# Patient Record
Sex: Male | Born: 1950 | Race: White | Hispanic: No | Smoking: Former smoker
Health system: Southern US, Community
[De-identification: ages and names within clinical notes are randomized; demographics above are authoritative.]

## PROBLEM LIST (undated history)

## (undated) DIAGNOSIS — E785 Hyperlipidemia, unspecified: Secondary | ICD-10-CM

## (undated) DIAGNOSIS — M21379 Foot drop, unspecified foot: Secondary | ICD-10-CM

## (undated) DIAGNOSIS — M6281 Muscle weakness (generalized): Secondary | ICD-10-CM

## (undated) DIAGNOSIS — R413 Other amnesia: Secondary | ICD-10-CM

## (undated) DIAGNOSIS — R531 Weakness: Secondary | ICD-10-CM

## (undated) DIAGNOSIS — I10 Essential (primary) hypertension: Secondary | ICD-10-CM

## (undated) DIAGNOSIS — R634 Abnormal weight loss: Secondary | ICD-10-CM

## (undated) HISTORY — DX: Essential (primary) hypertension: I10

## (undated) HISTORY — DX: Other amnesia: R41.3

## (undated) HISTORY — DX: Muscle weakness (generalized): M62.81

## (undated) HISTORY — DX: Weakness: R53.1

## (undated) HISTORY — DX: Abnormal weight loss: R63.4

## (undated) HISTORY — PX: SHOULDER SURGERY: SHX246

## (undated) HISTORY — PX: BACK SURGERY: SHX140

## (undated) HISTORY — PX: PERONEAL NERVE DECOMPRESSION: SHX2226

## (undated) HISTORY — DX: Foot drop, unspecified foot: M21.379

---

## 1999-04-24 ENCOUNTER — Ambulatory Visit (HOSPITAL_COMMUNITY): Admission: RE | Admit: 1999-04-24 | Discharge: 1999-04-24 | Payer: Self-pay | Admitting: Gastroenterology

## 1999-05-03 ENCOUNTER — Ambulatory Visit (HOSPITAL_COMMUNITY): Admission: RE | Admit: 1999-05-03 | Discharge: 1999-05-03 | Payer: Self-pay | Admitting: Gastroenterology

## 1999-05-03 ENCOUNTER — Encounter: Payer: Self-pay | Admitting: Gastroenterology

## 1999-10-09 ENCOUNTER — Ambulatory Visit (HOSPITAL_COMMUNITY): Admission: RE | Admit: 1999-10-09 | Discharge: 1999-10-09 | Payer: Self-pay | Admitting: Family Medicine

## 1999-10-09 ENCOUNTER — Encounter: Payer: Self-pay | Admitting: Family Medicine

## 1999-10-25 ENCOUNTER — Encounter: Payer: Self-pay | Admitting: Orthopedic Surgery

## 1999-10-28 ENCOUNTER — Encounter: Payer: Self-pay | Admitting: Orthopedic Surgery

## 1999-10-28 ENCOUNTER — Inpatient Hospital Stay (HOSPITAL_COMMUNITY): Admission: RE | Admit: 1999-10-28 | Discharge: 1999-10-29 | Payer: Self-pay | Admitting: Orthopedic Surgery

## 2002-04-05 ENCOUNTER — Ambulatory Visit (HOSPITAL_COMMUNITY): Admission: RE | Admit: 2002-04-05 | Discharge: 2002-04-05 | Payer: Self-pay | Admitting: Family Medicine

## 2002-04-05 ENCOUNTER — Encounter: Payer: Self-pay | Admitting: Family Medicine

## 2004-10-28 ENCOUNTER — Emergency Department (HOSPITAL_COMMUNITY): Admission: EM | Admit: 2004-10-28 | Discharge: 2004-10-28 | Payer: Self-pay | Admitting: *Deleted

## 2011-07-03 ENCOUNTER — Other Ambulatory Visit: Payer: Self-pay | Admitting: Family Medicine

## 2011-07-03 DIAGNOSIS — R109 Unspecified abdominal pain: Secondary | ICD-10-CM

## 2011-07-07 ENCOUNTER — Ambulatory Visit
Admission: RE | Admit: 2011-07-07 | Discharge: 2011-07-07 | Disposition: A | Discharge status: Home / Self Care | Payer: Managed Care, Other (non HMO) | Source: Ambulatory Visit | Attending: Family Medicine | Admitting: Family Medicine

## 2011-07-07 DIAGNOSIS — R109 Unspecified abdominal pain: Secondary | ICD-10-CM

## 2011-07-07 MED ORDER — IOHEXOL 300 MG/ML  SOLN
125.0000 mL | Freq: Once | INTRAMUSCULAR | Status: AC | PRN
  Administered 2011-07-07: 125 mL via INTRAVENOUS

## 2016-07-14 DIAGNOSIS — I10 Essential (primary) hypertension: Secondary | ICD-10-CM | POA: Diagnosis not present

## 2016-07-14 DIAGNOSIS — E782 Mixed hyperlipidemia: Secondary | ICD-10-CM | POA: Diagnosis not present

## 2016-07-14 DIAGNOSIS — F324 Major depressive disorder, single episode, in partial remission: Secondary | ICD-10-CM | POA: Diagnosis not present

## 2016-07-14 DIAGNOSIS — R7301 Impaired fasting glucose: Secondary | ICD-10-CM | POA: Diagnosis not present

## 2016-12-26 DIAGNOSIS — J069 Acute upper respiratory infection, unspecified: Secondary | ICD-10-CM | POA: Diagnosis not present

## 2016-12-30 DIAGNOSIS — R05 Cough: Secondary | ICD-10-CM | POA: Diagnosis not present

## 2016-12-30 DIAGNOSIS — R110 Nausea: Secondary | ICD-10-CM | POA: Diagnosis not present

## 2017-01-02 DIAGNOSIS — R42 Dizziness and giddiness: Secondary | ICD-10-CM | POA: Diagnosis not present

## 2017-01-19 ENCOUNTER — Ambulatory Visit
Admission: RE | Admit: 2017-01-19 | Discharge: 2017-01-19 | Disposition: A | Discharge status: Home / Self Care | Payer: BLUE CROSS/BLUE SHIELD | Source: Ambulatory Visit | Attending: Family Medicine | Admitting: Family Medicine

## 2017-01-19 ENCOUNTER — Other Ambulatory Visit: Payer: Self-pay | Admitting: Family Medicine

## 2017-01-19 DIAGNOSIS — R002 Palpitations: Secondary | ICD-10-CM | POA: Diagnosis not present

## 2017-01-19 DIAGNOSIS — R05 Cough: Secondary | ICD-10-CM

## 2017-01-19 DIAGNOSIS — R059 Cough, unspecified: Secondary | ICD-10-CM

## 2017-01-19 DIAGNOSIS — I10 Essential (primary) hypertension: Secondary | ICD-10-CM | POA: Diagnosis not present

## 2017-01-19 DIAGNOSIS — E782 Mixed hyperlipidemia: Secondary | ICD-10-CM | POA: Diagnosis not present

## 2017-01-19 DIAGNOSIS — R5383 Other fatigue: Secondary | ICD-10-CM | POA: Diagnosis not present

## 2017-01-19 DIAGNOSIS — Z125 Encounter for screening for malignant neoplasm of prostate: Secondary | ICD-10-CM | POA: Diagnosis not present

## 2017-01-19 DIAGNOSIS — Z0000 Encounter for general adult medical examination without abnormal findings: Secondary | ICD-10-CM | POA: Diagnosis not present

## 2017-01-19 DIAGNOSIS — R110 Nausea: Secondary | ICD-10-CM | POA: Diagnosis not present

## 2017-01-19 DIAGNOSIS — R7301 Impaired fasting glucose: Secondary | ICD-10-CM | POA: Diagnosis not present

## 2017-02-04 DIAGNOSIS — E782 Mixed hyperlipidemia: Secondary | ICD-10-CM | POA: Diagnosis not present

## 2017-10-15 DIAGNOSIS — R7303 Prediabetes: Secondary | ICD-10-CM | POA: Diagnosis not present

## 2017-10-15 DIAGNOSIS — I10 Essential (primary) hypertension: Secondary | ICD-10-CM | POA: Diagnosis not present

## 2017-10-15 DIAGNOSIS — E782 Mixed hyperlipidemia: Secondary | ICD-10-CM | POA: Diagnosis not present

## 2017-10-15 DIAGNOSIS — K5909 Other constipation: Secondary | ICD-10-CM | POA: Diagnosis not present

## 2017-11-16 DIAGNOSIS — E782 Mixed hyperlipidemia: Secondary | ICD-10-CM | POA: Diagnosis not present

## 2017-11-16 DIAGNOSIS — E291 Testicular hypofunction: Secondary | ICD-10-CM | POA: Diagnosis not present

## 2017-11-16 DIAGNOSIS — I10 Essential (primary) hypertension: Secondary | ICD-10-CM | POA: Diagnosis not present

## 2017-11-16 DIAGNOSIS — R7303 Prediabetes: Secondary | ICD-10-CM | POA: Diagnosis not present

## 2018-05-10 DIAGNOSIS — E782 Mixed hyperlipidemia: Secondary | ICD-10-CM | POA: Diagnosis not present

## 2018-05-10 DIAGNOSIS — F324 Major depressive disorder, single episode, in partial remission: Secondary | ICD-10-CM | POA: Diagnosis not present

## 2018-05-10 DIAGNOSIS — I10 Essential (primary) hypertension: Secondary | ICD-10-CM | POA: Diagnosis not present

## 2018-05-10 DIAGNOSIS — R7303 Prediabetes: Secondary | ICD-10-CM | POA: Diagnosis not present

## 2019-02-25 DIAGNOSIS — K219 Gastro-esophageal reflux disease without esophagitis: Secondary | ICD-10-CM | POA: Diagnosis not present

## 2019-02-25 DIAGNOSIS — F324 Major depressive disorder, single episode, in partial remission: Secondary | ICD-10-CM | POA: Diagnosis not present

## 2019-02-25 DIAGNOSIS — E782 Mixed hyperlipidemia: Secondary | ICD-10-CM | POA: Diagnosis not present

## 2019-02-25 DIAGNOSIS — R7303 Prediabetes: Secondary | ICD-10-CM | POA: Diagnosis not present

## 2019-02-25 DIAGNOSIS — Z125 Encounter for screening for malignant neoplasm of prostate: Secondary | ICD-10-CM | POA: Diagnosis not present

## 2019-02-25 DIAGNOSIS — I10 Essential (primary) hypertension: Secondary | ICD-10-CM | POA: Diagnosis not present

## 2019-03-15 DIAGNOSIS — M79641 Pain in right hand: Secondary | ICD-10-CM | POA: Diagnosis not present

## 2019-03-15 DIAGNOSIS — M13841 Other specified arthritis, right hand: Secondary | ICD-10-CM | POA: Diagnosis not present

## 2019-04-18 DIAGNOSIS — M1811 Unilateral primary osteoarthritis of first carpometacarpal joint, right hand: Secondary | ICD-10-CM | POA: Diagnosis not present

## 2019-04-18 DIAGNOSIS — M79642 Pain in left hand: Secondary | ICD-10-CM | POA: Diagnosis not present

## 2019-04-18 DIAGNOSIS — M79643 Pain in unspecified hand: Secondary | ICD-10-CM | POA: Diagnosis not present

## 2019-04-18 DIAGNOSIS — M2550 Pain in unspecified joint: Secondary | ICD-10-CM | POA: Diagnosis not present

## 2019-04-18 DIAGNOSIS — M79641 Pain in right hand: Secondary | ICD-10-CM | POA: Diagnosis not present

## 2019-04-18 DIAGNOSIS — R768 Other specified abnormal immunological findings in serum: Secondary | ICD-10-CM | POA: Diagnosis not present

## 2019-04-18 DIAGNOSIS — M1990 Unspecified osteoarthritis, unspecified site: Secondary | ICD-10-CM | POA: Diagnosis not present

## 2019-04-25 DIAGNOSIS — R768 Other specified abnormal immunological findings in serum: Secondary | ICD-10-CM | POA: Diagnosis not present

## 2019-04-25 DIAGNOSIS — M2550 Pain in unspecified joint: Secondary | ICD-10-CM | POA: Diagnosis not present

## 2019-04-25 DIAGNOSIS — D8989 Other specified disorders involving the immune mechanism, not elsewhere classified: Secondary | ICD-10-CM | POA: Diagnosis not present

## 2019-05-30 DIAGNOSIS — M1990 Unspecified osteoarthritis, unspecified site: Secondary | ICD-10-CM | POA: Diagnosis not present

## 2019-05-30 DIAGNOSIS — M79643 Pain in unspecified hand: Secondary | ICD-10-CM | POA: Diagnosis not present

## 2019-05-30 DIAGNOSIS — M2550 Pain in unspecified joint: Secondary | ICD-10-CM | POA: Diagnosis not present

## 2019-05-30 DIAGNOSIS — R768 Other specified abnormal immunological findings in serum: Secondary | ICD-10-CM | POA: Diagnosis not present

## 2019-07-05 DIAGNOSIS — R232 Flushing: Secondary | ICD-10-CM | POA: Diagnosis not present

## 2019-07-05 DIAGNOSIS — R5383 Other fatigue: Secondary | ICD-10-CM | POA: Diagnosis not present

## 2019-07-06 ENCOUNTER — Emergency Department (HOSPITAL_BASED_OUTPATIENT_CLINIC_OR_DEPARTMENT_OTHER): Payer: BC Managed Care – PPO

## 2019-07-06 ENCOUNTER — Other Ambulatory Visit: Payer: Self-pay

## 2019-07-06 ENCOUNTER — Emergency Department (HOSPITAL_BASED_OUTPATIENT_CLINIC_OR_DEPARTMENT_OTHER)
Admission: EM | Admit: 2019-07-06 | Discharge: 2019-07-06 | Disposition: A | Discharge status: Home / Self Care | Payer: BC Managed Care – PPO | Attending: Emergency Medicine | Admitting: Emergency Medicine

## 2019-07-06 ENCOUNTER — Encounter (HOSPITAL_BASED_OUTPATIENT_CLINIC_OR_DEPARTMENT_OTHER): Payer: Self-pay

## 2019-07-06 DIAGNOSIS — Z87891 Personal history of nicotine dependence: Secondary | ICD-10-CM | POA: Insufficient documentation

## 2019-07-06 DIAGNOSIS — Y92015 Private garage of single-family (private) house as the place of occurrence of the external cause: Secondary | ICD-10-CM | POA: Diagnosis not present

## 2019-07-06 DIAGNOSIS — S2241XA Multiple fractures of ribs, right side, initial encounter for closed fracture: Secondary | ICD-10-CM

## 2019-07-06 DIAGNOSIS — W1789XA Other fall from one level to another, initial encounter: Secondary | ICD-10-CM | POA: Insufficient documentation

## 2019-07-06 DIAGNOSIS — R5383 Other fatigue: Secondary | ICD-10-CM | POA: Diagnosis not present

## 2019-07-06 DIAGNOSIS — Y939 Activity, unspecified: Secondary | ICD-10-CM | POA: Insufficient documentation

## 2019-07-06 DIAGNOSIS — S2231XA Fracture of one rib, right side, initial encounter for closed fracture: Secondary | ICD-10-CM | POA: Diagnosis not present

## 2019-07-06 DIAGNOSIS — Y999 Unspecified external cause status: Secondary | ICD-10-CM | POA: Insufficient documentation

## 2019-07-06 DIAGNOSIS — R232 Flushing: Secondary | ICD-10-CM | POA: Diagnosis not present

## 2019-07-06 DIAGNOSIS — R7303 Prediabetes: Secondary | ICD-10-CM | POA: Diagnosis not present

## 2019-07-06 DIAGNOSIS — W19XXXA Unspecified fall, initial encounter: Secondary | ICD-10-CM | POA: Diagnosis not present

## 2019-07-06 DIAGNOSIS — S29001A Unspecified injury of muscle and tendon of front wall of thorax, initial encounter: Secondary | ICD-10-CM | POA: Diagnosis present

## 2019-07-06 DIAGNOSIS — R52 Pain, unspecified: Secondary | ICD-10-CM | POA: Diagnosis not present

## 2019-07-06 HISTORY — DX: Hyperlipidemia, unspecified: E78.5

## 2019-07-06 MED ORDER — LIDOCAINE 5 % EX PTCH: 1.0000 | MEDICATED_PATCH | CUTANEOUS | 0 refills | Status: AC

## 2019-07-06 MED ORDER — OXYCODONE HCL 5 MG PO TABS: 5.0000 mg | ORAL_TABLET | ORAL | 0 refills | Status: DC | PRN

## 2019-07-06 MED ORDER — HYDROCODONE-ACETAMINOPHEN 5-325 MG PO TABS
1.0000 | ORAL_TABLET | Freq: Once | ORAL | Status: DC
Start: 2019-07-06 — End: 2019-07-07

## 2019-07-06 NOTE — ED Triage Notes (Signed)
Pt presents via [facility] from home after fall in garage- pt hit edge of compressor to R sided chest- No LOC, or head injury. Pt c/o R sided rib pain- some abrasion noted. Pain increased with movement. Lung sounds clear/equal.

## 2019-07-06 NOTE — Discharge Instructions (Addendum)
Take 1000 mg of Tylenol 4 times a day, 800 mg of Motrin 3 times a day, Roxicodone as needed, lidocaine patch every 24 hours, use incentive spirometry as often as possible.  Return to the ED if worsening pain, fever, cough

## 2019-07-06 NOTE — ED Provider Notes (Signed)
[facility] EMERGENCY DEPARTMENT Provider Note   CSN: [id number] Arrival date & time: 07/06/19  2026    History   Chief Complaint Chief Complaint  Patient presents with  . Fall    HPI Donald Washington is a 68 y.o. male.     The history is provided by the patient.  Fall This is a new problem. The current episode started less than 1 hour ago. The problem occurs constantly. The problem has not changed since onset.Associated symptoms include chest pain (right posterior rib pain ). Pertinent negatives include no abdominal pain and no shortness of breath. Nothing aggravates the symptoms. Nothing relieves the symptoms. He has tried nothing for the symptoms. The treatment provided no relief.    Past Medical History:  Diagnosis Date  . Hyperlipemia     There are no active problems to display for this patient.   History reviewed. No pertinent surgical history.      Home Medications    Prior to Admission medications   Medication Sig Start Date End Date Taking? Authorizing Provider  lidocaine (LIDODERM) 5 % Place 1 patch onto the skin daily. Remove & Discard patch within 12 hours or as directed by MD 07/06/19 08/05/19  Lennice Sites, DO  oxyCODONE (ROXICODONE) 5 MG immediate release tablet Take 1 tablet (5 mg total) by mouth every 4 (four) hours as needed for up to 15 doses for severe pain. 07/06/19   Lennice Sites, DO    Family History No family history on file.  Social History Social History   Tobacco Use  . Smoking status: Former Smoker  Substance Use Topics  . Alcohol use: Never    Frequency: Never  . Drug use: Never     Allergies   Patient has no allergy information on record.   Review of Systems Review of Systems  Constitutional: Negative for chills and fever.  HENT: Negative for ear pain and sore throat.   Eyes: Negative for pain and visual disturbance.  Respiratory: Negative for cough and shortness of breath.   Cardiovascular: Positive for chest  pain (right posterior rib pain ). Negative for palpitations.  Gastrointestinal: Negative for abdominal pain and vomiting.  Genitourinary: Negative for dysuria and hematuria.  Musculoskeletal: Negative for arthralgias and back pain.  Skin: Negative for color change and rash.  Neurological: Negative for seizures and syncope.  All other systems reviewed and are negative.    Physical Exam Updated Vital Signs BP 125/84   Pulse 89   Temp 99.2 F (37.3 C) (Oral)   Resp 17   Ht 6\' 2"  (1.88 m)   Wt 88.5 kg   SpO2 98%   BMI 25.04 kg/m   Physical Exam Vitals signs and nursing note reviewed.  Constitutional:      Appearance: He is well-developed.  HENT:     Head: Normocephalic and atraumatic.     Nose: Nose normal.     Mouth/Throat:     Mouth: Mucous membranes are moist.  Eyes:     Extraocular Movements: Extraocular movements intact.     Conjunctiva/sclera: Conjunctivae normal.     Pupils: Pupils are equal, round, and reactive to light.  Neck:     Musculoskeletal: Normal range of motion and neck supple. No muscular tenderness.  Cardiovascular:     Rate and Rhythm: Normal rate and regular rhythm.     Pulses: Normal pulses.     Heart sounds: Normal heart sounds. No murmur.  Pulmonary:     Effort: Pulmonary effort  is normal. No respiratory distress.     Breath sounds: Normal breath sounds.  Abdominal:     Palpations: Abdomen is soft.     Tenderness: There is no abdominal tenderness.  Musculoskeletal: Normal range of motion.        General: Tenderness (TTP to posterior right ribs) present.  Skin:    General: Skin is warm and dry.  Neurological:     General: No focal deficit present.     Mental Status: He is alert and oriented to person, place, and time.     Cranial Nerves: No cranial nerve deficit.      ED Treatments / Results  Labs (all labs ordered are listed, but only abnormal results are displayed) Labs Reviewed - No data to display  EKG None  Radiology Dg  Chest 2 View  Result Date: 07/06/2019 CLINICAL DATA:  Right posterior lower rib pain. EXAM: CHEST - 2 VIEW COMPARISON:  None. FINDINGS: There are acute appearing mildly displaced fractures of the seventh and eighth ribs posteriorly on the right. There is no evidence for right-sided pneumothorax. Heart size is normal. Aortic calcifications are noted. No large pleural effusion. There are advanced degenerative changes of the right AC joint. There are degenerative changes of both glenohumeral joints. IMPRESSION: Acute minimally displaced fractures involving the seventh eighth ribs posteriorly on the right without evidence of a right-sided pneumothorax. Electronically Signed   By: Katherine Mantlehristopher  Green M.D.   On: 07/06/2019 20:52    Procedures Procedures (including critical care time)  Medications Ordered in ED Medications  HYDROcodone-acetaminophen (NORCO/VICODIN) 5-325 MG per tablet 1 tablet (0 tablets Oral Hold 07/06/19 2038)     Initial Impression / Assessment and Plan / ED Course  I have reviewed the triage vital signs and the nursing notes.  Pertinent labs & imaging results that were available during my care of the patient were reviewed by me and considered in my medical decision making (see chart for details).     Donald Washington is a 68 year old male with history of high cholesterol presents to the ED with right-sided rib pain after falling on top of object at home.  Patient tripped over back and hit the right side of his ribs.  Patient with clear breath sounds.  Tenderness over the posterior right ribs.  Has fracture of the seventh and eighth ribs.  No pneumothorax.  Will prescribe Roxicodone.  No active narcotic scripts.  Patient given narcotic pain medicine in the ED.  Overall pain is well controlled.  Recommend Tylenol, Motrin.  Prescribed lidocaine patch as well.  Given return precautions.  Given incentive spirometer and discharged in ED in good condition.  This chart was dictated using voice  recognition software.  Despite best efforts to proofread,  errors can occur which can change the documentation meaning.    Final Clinical Impressions(s) / ED Diagnoses   Final diagnoses:  Closed fracture of multiple ribs of right side, initial encounter    ED Discharge Orders         Ordered    oxyCODONE (ROXICODONE) 5 MG immediate release tablet  Every 4 hours PRN     07/06/19 2112    lidocaine (LIDODERM) 5 %  Every 24 hours     07/06/19 2112           Virgina NorfolkCuratolo, Teliyah Royal, DO 07/06/19 2114

## 2019-07-06 NOTE — Patient Instructions (Signed)
Instructed patient on the proper use of a flutter valve. Patient demonstrated proper use X ten without difficulty and pain involvement.  Patient has numerous rib fractures and explained benefits of using device every hour. Patient tolerated well.

## 2019-07-21 NOTE — ED Notes (Signed)
Copy of AVS reprinted for pt.

## 2019-07-28 DIAGNOSIS — F324 Major depressive disorder, single episode, in partial remission: Secondary | ICD-10-CM | POA: Diagnosis not present

## 2019-07-28 DIAGNOSIS — S2239XA Fracture of one rib, unspecified side, initial encounter for closed fracture: Secondary | ICD-10-CM | POA: Diagnosis not present

## 2019-07-28 DIAGNOSIS — I10 Essential (primary) hypertension: Secondary | ICD-10-CM | POA: Diagnosis not present

## 2019-07-28 DIAGNOSIS — R7303 Prediabetes: Secondary | ICD-10-CM | POA: Diagnosis not present

## 2019-08-08 DIAGNOSIS — E291 Testicular hypofunction: Secondary | ICD-10-CM | POA: Diagnosis not present

## 2019-08-22 ENCOUNTER — Other Ambulatory Visit: Payer: Self-pay | Admitting: Family Medicine

## 2019-08-22 DIAGNOSIS — R634 Abnormal weight loss: Secondary | ICD-10-CM | POA: Diagnosis not present

## 2019-08-22 DIAGNOSIS — M21372 Foot drop, left foot: Secondary | ICD-10-CM | POA: Diagnosis not present

## 2019-08-22 DIAGNOSIS — M21371 Foot drop, right foot: Secondary | ICD-10-CM | POA: Diagnosis not present

## 2019-08-22 DIAGNOSIS — R531 Weakness: Secondary | ICD-10-CM | POA: Diagnosis not present

## 2019-08-23 ENCOUNTER — Other Ambulatory Visit: Payer: Self-pay | Admitting: Family Medicine

## 2019-08-23 DIAGNOSIS — M21372 Foot drop, left foot: Secondary | ICD-10-CM

## 2019-08-24 ENCOUNTER — Other Ambulatory Visit: Payer: Self-pay | Admitting: Family Medicine

## 2019-08-24 DIAGNOSIS — R61 Generalized hyperhidrosis: Secondary | ICD-10-CM

## 2019-08-24 DIAGNOSIS — R634 Abnormal weight loss: Secondary | ICD-10-CM

## 2019-08-26 DIAGNOSIS — M419 Scoliosis, unspecified: Secondary | ICD-10-CM | POA: Diagnosis not present

## 2019-08-26 DIAGNOSIS — M47816 Spondylosis without myelopathy or radiculopathy, lumbar region: Secondary | ICD-10-CM | POA: Diagnosis not present

## 2019-08-26 DIAGNOSIS — M5136 Other intervertebral disc degeneration, lumbar region: Secondary | ICD-10-CM | POA: Diagnosis not present

## 2019-09-06 DIAGNOSIS — M21371 Foot drop, right foot: Secondary | ICD-10-CM | POA: Diagnosis not present

## 2019-09-06 DIAGNOSIS — M21372 Foot drop, left foot: Secondary | ICD-10-CM | POA: Diagnosis not present

## 2019-09-12 DIAGNOSIS — R6881 Early satiety: Secondary | ICD-10-CM | POA: Diagnosis not present

## 2019-09-12 DIAGNOSIS — M21372 Foot drop, left foot: Secondary | ICD-10-CM | POA: Diagnosis not present

## 2019-09-12 DIAGNOSIS — I10 Essential (primary) hypertension: Secondary | ICD-10-CM | POA: Diagnosis not present

## 2019-09-12 DIAGNOSIS — E782 Mixed hyperlipidemia: Secondary | ICD-10-CM | POA: Diagnosis not present

## 2019-09-15 ENCOUNTER — Other Ambulatory Visit: Payer: Self-pay | Admitting: Family Medicine

## 2019-09-15 DIAGNOSIS — R6881 Early satiety: Secondary | ICD-10-CM

## 2019-09-15 DIAGNOSIS — R634 Abnormal weight loss: Secondary | ICD-10-CM

## 2019-09-26 DIAGNOSIS — G5730 Lesion of lateral popliteal nerve, unspecified lower limb: Secondary | ICD-10-CM | POA: Diagnosis not present

## 2019-09-29 ENCOUNTER — Ambulatory Visit
Admission: RE | Admit: 2019-09-29 | Discharge: 2019-09-29 | Disposition: A | Discharge status: Home / Self Care | Payer: BC Managed Care – PPO | Source: Ambulatory Visit | Attending: Family Medicine | Admitting: Family Medicine

## 2019-09-29 DIAGNOSIS — R634 Abnormal weight loss: Secondary | ICD-10-CM

## 2019-09-29 DIAGNOSIS — R6881 Early satiety: Secondary | ICD-10-CM

## 2019-10-10 DIAGNOSIS — G5732 Lesion of lateral popliteal nerve, left lower limb: Secondary | ICD-10-CM | POA: Diagnosis not present

## 2019-10-10 DIAGNOSIS — R6881 Early satiety: Secondary | ICD-10-CM | POA: Diagnosis not present

## 2019-10-10 DIAGNOSIS — I10 Essential (primary) hypertension: Secondary | ICD-10-CM | POA: Diagnosis not present

## 2019-10-10 DIAGNOSIS — G5731 Lesion of lateral popliteal nerve, right lower limb: Secondary | ICD-10-CM | POA: Diagnosis not present

## 2019-11-04 ENCOUNTER — Other Ambulatory Visit: Payer: Self-pay | Admitting: Family Medicine

## 2019-11-04 DIAGNOSIS — R61 Generalized hyperhidrosis: Secondary | ICD-10-CM

## 2019-11-04 DIAGNOSIS — R634 Abnormal weight loss: Secondary | ICD-10-CM

## 2019-11-15 ENCOUNTER — Ambulatory Visit
Admission: RE | Admit: 2019-11-15 | Discharge: 2019-11-15 | Disposition: A | Discharge status: Home / Self Care | Payer: Medicare Other | Source: Ambulatory Visit | Attending: Family Medicine | Admitting: Family Medicine

## 2019-11-15 ENCOUNTER — Other Ambulatory Visit: Payer: Self-pay

## 2019-11-15 DIAGNOSIS — R61 Generalized hyperhidrosis: Secondary | ICD-10-CM

## 2019-11-15 DIAGNOSIS — R634 Abnormal weight loss: Secondary | ICD-10-CM

## 2019-11-15 MED ORDER — IOPAMIDOL (ISOVUE-300) INJECTION 61%
100.0000 mL | Freq: Once | INTRAVENOUS | Status: AC | PRN
  Administered 2019-11-15: 100 mL via INTRAVENOUS

## 2020-02-15 ENCOUNTER — Encounter: Payer: Self-pay | Admitting: Neurology

## 2020-02-16 ENCOUNTER — Telehealth: Payer: Self-pay | Admitting: Neurology

## 2020-02-16 ENCOUNTER — Ambulatory Visit: Payer: Medicare Other | Admitting: Neurology

## 2020-02-16 NOTE — Telephone Encounter (Signed)
Called the patient to advise that our office is closing today due to the inclement weather. Patient has asked to call back to get rescheduled. Advised the patient that our office may not be available today or tomorrow to take calls due to possibly closing tomorrow as well. Advised that if he is unable to reach [place] to try again on Monday and we would get him rescheduled. The patient can be placed in a new sleep consult slot if no other opening's have come available to ensure the patient doesn't have a long wait.

## 2020-02-28 ENCOUNTER — Encounter: Payer: Self-pay | Admitting: Neurology

## 2020-02-28 ENCOUNTER — Other Ambulatory Visit: Payer: Self-pay

## 2020-02-28 ENCOUNTER — Ambulatory Visit: Payer: Medicare Other | Admitting: Neurology

## 2020-02-28 VITALS — BP 132/76 | HR 77 | Temp 97.0°F | Ht 74.0 in | Wt 169.5 lb

## 2020-02-28 DIAGNOSIS — R410 Disorientation, unspecified: Secondary | ICD-10-CM

## 2020-02-28 DIAGNOSIS — G1220 Motor neuron disease, unspecified: Secondary | ICD-10-CM | POA: Diagnosis not present

## 2020-02-28 DIAGNOSIS — R531 Weakness: Secondary | ICD-10-CM | POA: Diagnosis not present

## 2020-02-28 DIAGNOSIS — R634 Abnormal weight loss: Secondary | ICD-10-CM

## 2020-02-28 NOTE — Progress Notes (Addendum)
PATIENT: Donald Washington DOB: 02-16-1951  Chief Complaint  Patient presents with  . Memory Changes    MMSE 26/30 - 6 animals. He is here with his daughter, Aram Beecham. He is having worsening memory loss. Gives the following examples: forgetfulness, getting lost while driving, his daughter has taken over paying his bills.   . Foot Drop    His bilateral foot drop has improved with surgery. He is still in PT twice weely.   . Muscle Loss    His daughter says he has a sudden, unplanned quick loss of weight last year (now over 50lbs). He has noticed muscle loss and generalized weakness.   . Neurosurgery    Jone, Kermit Balo, MD - referring provider  . PCP    [facility] [place]     HISTORICAL  Donald Washington is a 69 year old male, seen in request by neurosurgeon Dr. Marikay Alar for evaluation of gait abnormality, muscle loss, rapid weight loss, initial evaluation was with his daughter Aram Beecham on February 28, 2020   I have reviewed and summarized the referring note from the referring physician.  He had past medical history of hypertension, hyperlipidemia, depression, taking Effexor 150 mg daily for many years, used to work as a Scientist, water quality.  He has lived by himself for many years, when he visit his daughter in Jan 2020, there was no significant abnormality found  He apparently had subacute onset gait abnormality, fell in July 2020, with right rib fracture, since then, he was noted to have rapid decline, he had left foot drop, difficulty clear his left foot from the floor, 2 months later by September 2020, he was noted to have right foot drop, he was seen by neurosurgeon Dr. 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, he denies difficulty handling his  job at that time, he was not able to go back to his work since his rib fracture on July 05, 2020, got confused easily, does not know how to pay his bill, daughter also noticed some personality change, he used to be very independent, now he has mellowed down, is more willing to accept help  In addition, he was noted to have 50 pound weight loss from February to June 2020, he denies swallowing difficulty, but complains of thick secretion at the back of his throat, lack of appetite, he denies droopy eyelid, double vision, he denies significant sensory loss, no bowel and bladder incontinence,  MRI of lumbar spine at [facility] on August 26, 2019 report, 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, right cord asymmetric disc osteophyte, causing mild canal stenosis, right foraminal narrowing at 2013, L3 and 4 hemilaminectomy with well-maintained central canal, right foraminal disc osteophyte cause severe neural foraminal narrowing, levoscoliosis,  CT of chest, abdomen, pelvic November 2020, no evidence of malignancy, coronary artery calcification and aortic atherosclerosis, degenerative changes of the lumbar spine  MRI of the brain report from [facility] on January 03, 2020, no acute abnormality, mild small vessel disease in basal ganglia  REVIEW OF SYSTEMS: Full 14 system review of systems performed and notable only for as above All other review of systems were negative.  ALLERGIES: No Known Allergies  HOME MEDICATIONS: Current Outpatient Medications  Medication Sig Dispense Refill  .  aspirin EC 81 MG tablet Take 81 mg by mouth daily.    Marland Kitchen atorvastatin (LIPITOR) 80 MG tablet Take 80 mg by mouth daily.     . valsartan-hydrochlorothiazide (DIOVAN-HCT) 320-25 MG tablet Take 1 tablet by mouth daily.     Marland Kitchen venlafaxine XR (EFFEXOR-XR) 150 MG 24 hr capsule Take 150 mg by mouth daily.      No current facility-administered medications for this visit.    PAST  MEDICAL HISTORY: Past Medical History:  Diagnosis Date  . Foot drop    bilateral  . Generalized weakness   . Hyperlipemia   . Hypertension   . Memory changes   . Muscle weakness   . Weight loss, unintentional     PAST SURGICAL HISTORY: Past Surgical History:  Procedure Laterality Date  . BACK SURGERY     low back - 80's  . PERONEAL NERVE DECOMPRESSION Bilateral   . SHOULDER SURGERY Right    rotator cuff - 90's    FAMILY HISTORY: Family History  Problem Relation Age of Onset  . Pneumonia Mother   . Esophageal cancer Father     SOCIAL HISTORY: Social History   Socioeconomic History  . Marital status: Divorced    Spouse name: Not on file  . Number of children: 2  . Years of education: 15  . Highest education level: High school graduate  Occupational History  . Occupation: Research officer, trade union  Tobacco Use  . Smoking status: Former Games developer  . Smokeless tobacco: Never Used  Substance and Sexual Activity  . Alcohol use: Yes    Comment: 02/28/20 - He was having 2 drinks per night. None in 2 weeks.  . Drug use: Never  . Sexual activity: Never  Other Topics Concern  . Not on file  Social History Narrative   Lives alone.   Right-handed.   Caffeine use: 2 cups per day.   Social Determinants of Health   Financial Resource Strain:   . Difficulty of Paying Living Expenses: Not on file  Food Insecurity:   . Worried About Programme researcher, broadcasting/film/video in the Last Year: Not on file  . Ran Out of Food in the Last Year: Not on file  Transportation Needs:   . Lack of Transportation (Medical): Not on file  . Lack of Transportation (Non-Medical): Not on file  Physical Activity:   . Days of Exercise per Week: Not on file  . Minutes of Exercise per Session: Not on file  Stress:   . Feeling of Stress : Not on file  Social Connections:   . Frequency of Communication with Friends and Family: Not on file  . Frequency of Social Gatherings with Friends and Family: Not on file  . Attends  Religious Services: Not on file  . Active Member of Clubs or Organizations: Not on file  . Attends Banker Meetings: Not on file  . Marital Status: Not on file  Intimate Partner Violence:   . Fear of Current or Ex-Partner: Not on file  . Emotionally Abused: Not on file  . Physically Abused: Not on file  . Sexually Abused: Not on file     PHYSICAL EXAM   Vitals:   02/28/20 1447  BP: 132/76  Pulse: 77  Temp: (!) 97 F (36.1 C)  Weight: 169 lb 8 oz (76.9 kg)  Height: 6\' 2"  (1.88 m)    Not recorded      Body mass index is 21.76 kg/m.  PHYSICAL EXAMNIATION:  Gen: NAD, conversant,  well nourised, well groomed                     Cardiovascular: Regular rate rhythm, no peripheral edema, warm, nontender. Eyes: Conjunctivae clear without exudates or hemorrhage Neck: Supple, no carotid bruits. Pulmonary: Clear to auscultation bilaterally   NEUROLOGICAL EXAM: MMSE - Mini Mental State Exam 02/28/2020  Orientation to time 5  Orientation to Place 4  Registration 3  Attention/ Calculation 5  Recall 0  Language- name 2 objects 2  Language- repeat 1  Language- follow 3 step command 3  Language- read & follow direction 1  Write a sentence 1  Copy design 1  Total score 26  animal naming 6.   CRANIAL NERVES: CN II: Visual fields are full to confrontation. Pupils are round equal and briskly reactive to light. CN III, IV, VI: extraocular movement are normal.  Cover and uncover testing showed mild bilateral medial rectus weakness, no ptosis. CN V: Facial sensation is intact to light touch CN VII: Face is symmetric, he has mild bilateral eye closure, cheek puff weakness, CN VIII: Hearing is normal to causal conversation. CN IX, X: Phonation is normal. CN XI: Head turning and shoulder shrug are intact, normal tongue movement, no atrophy, normal tongue movement  MOTOR: normal muscle tone, I was not able to appreciate any muscle fasciculations, he has mild neck flexion,  shoulder abduction, external flexion, elbow flexion, extension weakness, mild bilateral hip flexion, knee extension weakness, bilateral ankle dorsiflexion weakness, left side is moderate, right side is mild, mild to moderate bilateral ankle eversion, inversion weakness  REFLEXES: Reflexes are 1 and symmetric at the biceps, triceps, knees, and absent at ankles. Plantar responses are flexor.  SENSORY: Length dependent decreased to light touch, pinprick, vibratory sensation to ankle level.  COORDINATION: There is no trunk or limb dysmetria noted.  GAIT/STANCE: Needs push-up to get up from seated position, mildly unsteady, dragging his left leg more, cannot perform heel walking, difficulty with tandem and tiptoe walking Romberg is absent.   DIAGNOSTIC DATA (LABS, IMAGING, TESTING) - I reviewed patient records, labs, notes, testing and imaging myself where available.   ASSESSMENT AND PLAN  ALONSO GAPINSKI is a 70 y.o. male   Subacute onset of muscle weakness, weight loss,  On examination, he has mild eye closure, cheek puff, medial rectus weakness, mild bilateral proximal upper and lower extremity muscle weakness, bilateral ankle dorsiflexion weakness left worse than right, mild length dependent sensory changes, normal reflexes,  differentiation diagnosis including motor neuron disease, versus neuromuscular junctional disorder,  MRI of cervical, lumbar, thoracic spine to rule out structural lesion  EMG nerve conduction study  Laboratory evaluation for potential treatable etiology  Marcial Pacas, M.D. Ph.D.  [facility] [address] [place], [place] [address] Ph: [phone] Fax: [phone]  CC: Referring Provider

## 2020-02-29 ENCOUNTER — Telehealth: Payer: Self-pay | Admitting: Neurology

## 2020-02-29 NOTE — Telephone Encounter (Signed)
[employer] medicare order sent to GI. No auth they will reach out to the patient to schedule.  

## 2020-03-01 ENCOUNTER — Other Ambulatory Visit: Payer: Self-pay

## 2020-03-01 ENCOUNTER — Ambulatory Visit: Payer: Medicare Other | Admitting: Neurology

## 2020-03-01 ENCOUNTER — Ambulatory Visit (INDEPENDENT_AMBULATORY_CARE_PROVIDER_SITE_OTHER): Payer: Medicare Other | Admitting: Neurology

## 2020-03-01 DIAGNOSIS — R634 Abnormal weight loss: Secondary | ICD-10-CM | POA: Diagnosis not present

## 2020-03-01 DIAGNOSIS — R531 Weakness: Secondary | ICD-10-CM

## 2020-03-01 DIAGNOSIS — R410 Disorientation, unspecified: Secondary | ICD-10-CM

## 2020-03-01 NOTE — Progress Notes (Addendum)
PATIENT: Donald Washington DOB: [birth year]  No chief complaint on file.    HISTORICAL  Donald Washington is a 69 year old male, seen in request by neurosurgeon Dr. Sherley Bounds for evaluation of gait abnormality, muscle loss, rapid weight loss, initial evaluation was with his daughter Donald Washington on February 28, 2020   I have reviewed and summarized the referring note from the referring physician.  He had past medical history of hypertension, hyperlipidemia, depression, taking Effexor 150 mg daily for many years, used to work as a Database administrator.  He has lived by himself for many years, when he visit his daughter in Jan 2020, there was no significant abnormality found  He apparently had subacute onset gait abnormality, fell in July 2020, with right rib fracture, since then, he was noted to have rapid decline, he had left foot drop, difficulty clearing his left foot from the floor, 2 months later by September 2020, he was noted to have right foot drop, he was seen by neurosurgeon Dr. 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, he denies difficulty handling his job at that time, he was not able to go back to his work since his rib fracture on July 06, 2019, got confused easily, does not know how to pay his bill, daughter also noticed some personality change, he used to be very independent, now he has mellowed down, is more willing to accept help  In addition, he was noted to have 50 pound weight loss from February to June 2020, he denies swallowing difficulty, but complains of thick secretion at the back of his throat, lack of appetite, he denies droopy eyelid, double vision, he denies significant sensory loss, no bowel and bladder incontinence,  MRI of lumbar spine at [facility] on August 26, 2019 report, 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, right cord asymmetric disc osteophyte, causing mild canal stenosis, right foraminal narrowing at 2013, L3 and 4 hemilaminectomy with well-maintained central canal, right foraminal disc osteophyte cause severe neural foraminal narrowing, levoscoliosis,  CT of chest, abdomen, pelvic November 2020, no evidence of malignancy, coronary artery calcification and aortic atherosclerosis, degenerative changes of the lumbar spine  MRI of the brain report from [facility] on January 03, 2020, no acute abnormality, mild small vessel disease in basal ganglia  UPDATE March 01 2020: He return for electrodiagnostic study today, which showed evidence of widespread chronic neuropathic changes involving right orbicularis oculi, right sternocleidomastoid, right cervical myotomes and bilateral lumbosacral myotomes.  I was not able to appreciate any spontaneous activity in the right thoracic paraspinal muscles, or right cervical, lumbar sacral paraspinal muscles.  In addition, there is evidence of mild axonal peripheral neuropathy, bilateral carpal tunnel syndromes.  Laboratory evaluation on February 28, 2020 showed normal negative N56, RPR, HIV, folic acid, C-reactive protein, TSH, CPK, ESR, ANA, copper, angiotensin-converting enzyme, vitamin D, Lyme titer, CMP, CBC showed no significant abnormality.  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  MRI of brain and cervical spine are  still pending.  REVIEW OF SYSTEMS: Full 14 system review of systems performed and notable only  for as above All other review of systems were negative.  ALLERGIES: No Known Allergies  HOME MEDICATIONS: Current Outpatient Medications  Medication Sig Dispense Refill  . aspirin EC 81 MG tablet Take 81 mg by mouth daily.    Marland Kitchen atorvastatin (LIPITOR) 80 MG tablet Take 80 mg by mouth daily.     . valsartan-hydrochlorothiazide (DIOVAN-HCT) 320-25 MG tablet Take 1 tablet by mouth daily.     Marland Kitchen venlafaxine XR (EFFEXOR-XR) 150 MG 24 hr capsule Take 150 mg by mouth daily.      No current facility-administered medications for this visit.    PAST MEDICAL HISTORY: Past Medical History:  Diagnosis Date  . Foot drop    bilateral  . Generalized weakness   . Hyperlipemia   . Hypertension   . Memory changes   . Muscle weakness   . Weight loss, unintentional     PAST SURGICAL HISTORY: Past Surgical History:  Procedure Laterality Date  . BACK SURGERY     low back - 80's  . PERONEAL NERVE DECOMPRESSION Bilateral   . SHOULDER SURGERY Right    rotator cuff - 90's    FAMILY HISTORY: Family History  Problem Relation Age of Onset  . Pneumonia Mother   . Esophageal cancer Father     SOCIAL HISTORY: Social History   Socioeconomic History  . Marital status: Divorced    Spouse name: Not on file  . Number of children: 2  . Years of education: 85  . Highest education level: High school graduate  Occupational History  . Occupation: Media planner  Tobacco Use  . Smoking status: Former Research scientist (life sciences)  . Smokeless tobacco: Never Used  Substance and Sexual Activity  . Alcohol use: Yes    Comment: 02/28/20 - He was having 2 drinks per night. None in 2 weeks.  . Drug use: Never  . Sexual activity: Never  Other Topics Concern  . Not on file  Social History Narrative   Lives alone.   Right-handed.   Caffeine use: 2 cups per day.   Social Determinants of Health   Financial Resource Strain:   . Difficulty of Paying Living Expenses: Not on file  Food Insecurity:   . Worried About  Charity fundraiser in the Last Year: Not on file  . Ran Out of Food in the Last Year: Not on file  Transportation Needs:   . Lack of Transportation (Medical): Not on file  . Lack of Transportation (Non-Medical): Not on file  Physical Activity:   . Days of Exercise per Week: Not on file  . Minutes of Exercise per Session: Not on file  Stress:   . Feeling of Stress : Not on file  Social Connections:   . Frequency of Communication with Friends and Family: Not on file  . Frequency of Social Gatherings with Friends and Family: Not on file  . Attends Religious Services: Not on file  . Active Member of Clubs or Organizations: Not on file  . Attends Archivist Meetings: Not on file  . Marital Status: Not on file  Intimate Partner Violence:   . Fear of Current or Ex-Partner: Not on file  . Emotionally Abused: Not on file  . Physically Abused: Not on file  . Sexually Abused: Not on file     PHYSICAL EXAM   There were no vitals filed for this visit.  Not recorded  There is no height or weight on file to calculate BMI.  PHYSICAL EXAMNIATION:  NEUROLOGICAL EXAM: MMSE - Mini Mental State Exam 02/28/2020  Orientation to time 5  Orientation to Place 4  Registration 3  Attention/ Calculation 5  Recall 0  Language- name 2 objects 2  Language- repeat 1  Language- follow 3 step command 3  Language- read & follow direction 1  Write a sentence 1  Copy design 1  Total score 26  animal naming 6.   CRANIAL NERVES: CN II: Visual fields are full to confrontation. Pupils are round equal and briskly reactive to light. CN III, IV, VI: extraocular movement are normal.   CN V: Facial sensation is intact to light touch CN VII: Face is symmetric, he has mild bilateral eye closure, cheek puff weakness, CN VIII: Hearing is normal to causal conversation. CN IX, X: Phonation is normal. CN XI: Head turning and shoulder shrug are intact, normal tongue movement, no atrophy, normal  tongue movement  MOTOR: normal muscle tone, no muscle fasciculations, he has mild neck flexion, shoulder abduction, external flexion, elbow flexion, extension weakness, mild bilateral hip flexion, knee extension weakness, bilateral ankle dorsiflexion weakness, left side is moderate, right side is mild, mild to moderate bilateral ankle eversion, inversion weakness  REFLEXES: Reflexes are 1 and symmetric at the biceps, triceps, 2/2 knees, and absent at ankles. Plantar responses are extensor bilaterally  SENSORY: Length dependent decreased to light touch, pinprick, vibratory sensation to ankle level.  COORDINATION: There is no trunk or limb dysmetria noted.  GAIT/STANCE: Needs push-up to get up from seated position, mildly unsteady,     DIAGNOSTIC DATA (LABS, IMAGING, TESTING) - I reviewed patient records, labs, notes, testing and imaging myself where available.   ASSESSMENT AND PLAN  RAYSHAWN MANEY is a 69 y.o. male   Subacute onset of muscle weakness, weight loss,  Today's electrodiagnostic study showed evidence of widespread chronic neuropathic changes involving right orbicularis oculi, sternocleidomastoid, genioglossus, right cervical myotomes, and bilateral lumbosacral myotomes.  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 Ph.D.  [facility] [address], [place], [place] [address] Ph: [phone] Fax: [phone]  CC: Referring Provider

## 2020-03-01 NOTE — Procedures (Signed)
Full Name: Donald Washington Gender: Male MRN #: [id number] Date of Birth: 1951-08-24    Visit Date: 03/01/2020 07:38 Age: 69 Years Examining Physician: Levert Feinstein, MD  Referring Physician: Levert Feinstein, MD Height: 6 feet 2 inch History: 69 year old male presented with subacute onset, painless, progressive muscle weakness, weight loss.  Summary of the tests:  Nerve conduction study:  Left superficial peroneal, left median sensory responses showed mild to moderately prolonged peak latency, with mild to moderately decreased snap amplitude.  Bilateral ulnar, right median sensory response showed to be prolonged peak latency, with normal snap amplitude.  Bilateral sural, radial sensory responses were normal.  Bilateral tibial, right ulnar, left median motor responses were normal.  Bilateral peroneal to EDB motor responses showed decreased Amplitude, with mildly slow conduction velocity.  Left ulnar, and median motor responses showed moderately decreased CMAP amplitude, left ulnar motor responses also showed mildly prolonged distal latency.  Electromyography: Extensive needle examination was performed at selected muscles of bilateral lower extremity; right upper extremity; right sternocleidomastoid, right orbicularis oculi, right genioglossus, right cervical, thoracic, lumbosacral paraspinal muscles.  There was widespread chronic neuropathic changes involving bilateral lumbar sacral, right cervical myotomes, there is also evidence of chronic neuropathic changes involving right orbicularis oculi, sternocleidomastoid, genioglossus.  There was no spontaneous activity at right cervical, thoracic, lumbosacral paraspinal muscles.   Conclusion: This is an abnormal study.  There is electrodiagnostic evidence of widespread chronic neuropathic changes involving right cervical, bilateral lumbosacral myotomes, and also right bulbar muscles.  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   Differentiation diagnoses also include paraneoplastic syndrome, nutritional deficiency, infectious, inflammatory process.    ------------------------------- Levert Feinstein, M.D. PhD  [facility] [address] [place], [place] [address] Tel: [phone] Fax: [phone]         [facility]    Nerve / Sites Muscle Latency Ref. Amplitude Ref. Rel Amp Segments Distance Velocity Ref. Area    ms ms mV mV %  cm m/s m/s mVms  L Median - APB     Wrist APB 4.4 ?4.4 4.7 ?4.0 100 Wrist - APB 7   36.5     Upper arm APB 9.5  4.8  104 Upper arm - Wrist 25 49 ?49 36.5  R Median - APB     Wrist APB 4.3 ?4.4 2.8 ?4.0 100 Wrist - APB 7   13.5     Upper arm APB 9.4  2.9  102 Upper arm - Wrist 25 49 ?49 13.7  L Ulnar - ADM     Wrist ADM 3.5 ?3.3 2.0 ?6.0 100 Wrist - ADM 7   5.5     B.Elbow ADM 8.6  6.8  335 B.Elbow - Wrist 23 45 ?49 34.3     A.Elbow ADM 10.9  7.2  105 A.Elbow - B.Elbow 10 43 ?49 46.1         A.Elbow - Wrist      R Ulnar - ADM     Wrist ADM 3.3 ?3.3 6.2 ?6.0 100 Wrist - ADM 7   33.8     B.Elbow ADM 7.8  4.5  72.6 B.Elbow - Wrist 23 51 ?49 26.4     A.Elbow ADM 9.8  4.2  94.3 A.Elbow - B.Elbow 10 51 ?49 25.7         A.Elbow - Wrist      L Peroneal - EDB     Ankle EDB 6.1 ?6.5 0.2 ?2.0 100 Ankle -  EDB 9   0.9     Fib head EDB 15.2  0.2  76 Fib head - Ankle 31 34 ?44 0.7     Pop fossa EDB 19.3  0.2  92.8 Pop fossa - Fib head 10 25 ?44 0.7         Pop fossa - Ankle      R Peroneal - EDB     Ankle EDB 4.9 ?6.5 1.3 ?2.0 100 Ankle - EDB 9   5.2     Fib head EDB 12.1  0.9  64.4 Fib head - Ankle 33 46 ?44 5.1     Pop fossa EDB 14.4  0.7  85.9 Pop fossa - Fib head 10 44 ?44 3.5         Pop fossa - Ankle      L Tibial - AH     Ankle AH 4.6 ?5.8 8.7 ?4.0 100 Ankle - AH 9   19.3     Pop fossa AH 15.1  5.6  64.3 Pop fossa - Ankle 44 42 ?41 18.0  R Tibial - AH     Ankle AH 4.1 ?5.8 8.9 ?4.0 100 Ankle - AH 9   18.5     Pop fossa AH 14.8  4.9  55.2 Pop fossa - Ankle 44 41 ?41 16.2                      SNC    Nerve / Sites Rec. Site Peak Lat Ref.  Amp Ref. Segments Distance    ms ms V V  cm  L Radial - Anatomical snuff box (Forearm)     Forearm Wrist 2.9 ?2.9 16 ?15 Forearm - Wrist 10  R Radial - Anatomical snuff box (Forearm)     Forearm Wrist 2.9 ?2.9 17 ?15 Forearm - Wrist 10  L Sural - Ankle (Calf)     Calf Ankle 4.0 ?4.4 7 ?6 Calf - Ankle 14  R Sural - Ankle (Calf)     Calf Ankle 3.9 ?4.4 10 ?6 Calf - Ankle 14  L Superficial peroneal - Ankle     Lat leg Ankle 5.1 ?4.4 2 ?6 Lat leg - Ankle 14  R Superficial peroneal - Ankle     Lat leg Ankle 3.9 ?4.4 3 ?6 Lat leg - Ankle 14  L Median - Orthodromic (Dig II, Mid palm)     Dig II Wrist 4.6 ?3.4 7 ?10 Dig II - Wrist 15  R Median - Orthodromic (Dig II, Mid palm)     Dig II Wrist 4.4 ?3.4 11 ?10 Dig II - Wrist 15  L Ulnar - Orthodromic, (Dig V, Mid palm)     Dig V Wrist 3.9 ?3.1 7 ?5 Dig V - Wrist 11  R Ulnar - Orthodromic, (Dig V, Mid palm)     Dig V Wrist 3.5 ?3.1 6 ?5 Dig V - Wrist 71                         F  Wave    Nerve F Lat Ref.   ms ms  L Tibial - AH 56.6 ?56.0  L Median - APB 32.1 ?31.0  L Ulnar - ADM 33.2 ?32.0  R Tibial - AH 56.6 ?56.0  R Median - APB 34.4 ?31.0  R Ulnar - ADM 32.0 ?32.0                 EMG  Summary Table    Spontaneous MUAP Recruitment  Muscle IA Fib PSW Fasc Other Amp Dur. Poly Pattern  R. Tibialis anterior Increased 1+ None None _______ Increased Increased 1+ Reduced  R. Tibialis posterior Increased 1+ None None _______ Increased Increased 1+ Reduced  R. Peroneus longus Increased 1+ None None _______ Increased Increased 1+ Reduced  R. Gastrocnemius (Medial head) Increased None None None _______ Increased Increased 1+ Reduced  R. Vastus lateralis Increased None None None _______ Increased Increased 1+ Reduced  L. Tibialis anterior Increased 2+ 2+ None _______ Increased Increased 2+ Reduced  L. Tibialis posterior Increased 1+ None Occasional _______ Normal Increased 1+  Reduced  L. Peroneus longus Increased 1+ None None _______ Increased Increased 1+ Reduced  L. Gastrocnemius (Medial head) Increased 1+ None None _______ Increased Increased Normal Reduced  L. Vastus lateralis Increased None None None _______ Increased Increased 1+ Reduced  R. Lumbar paraspinals (low) Normal None None None _______ Normal Normal Normal Normal  R. First dorsal interosseous Increased None None None _______ Increased Increased 1+ Reduced  R. Pronator teres Increased None None None _______ Normal Normal Normal Reduced  R. Biceps brachii Increased None None None _______ Normal Normal Normal Reduced  R. Deltoid Increased None None None _______ Normal Normal Normal Reduced  R. Extensor digitorum communis Increased None None None _______ Increased Increased 1+ Reduced  R. Cervical paraspinals Normal None None None _______ Normal Normal Normal Normal  R. Thoracic paraspinals (low) Normal None None None _______ Normal Normal Normal Normal  R. Thoracic paraspinals (mid) Normal None None None _______ Normal Normal Normal Normal  R. Orbicularis oculi Increased None None None _______ Increased Increased Normal Reduced  R. Genioglossus Normal None None None _______ Normal Normal Normal Reduced

## 2020-03-05 ENCOUNTER — Other Ambulatory Visit: Payer: Self-pay | Admitting: Neurology

## 2020-03-05 DIAGNOSIS — R410 Disorientation, unspecified: Secondary | ICD-10-CM

## 2020-03-05 DIAGNOSIS — R531 Weakness: Secondary | ICD-10-CM

## 2020-03-05 LAB — CK: Total CK: 78 U/L (ref 41–331)

## 2020-03-05 LAB — CBC WITH DIFFERENTIAL/PLATELET
Basophils Absolute: 0 10*3/uL (ref 0.0–0.2)
Basos: 0 %
EOS (ABSOLUTE): 0.2 10*3/uL (ref 0.0–0.4)
Eos: 2 %
Hematocrit: 36.9 % — ABNORMAL LOW (ref 37.5–51.0)
Hemoglobin: 12.9 g/dL — ABNORMAL LOW (ref 13.0–17.7)
Immature Grans (Abs): 0 10*3/uL (ref 0.0–0.1)
Immature Granulocytes: 0 %
Lymphocytes Absolute: 1.5 10*3/uL (ref 0.7–3.1)
Lymphs: 20 %
MCH: 33.3 pg — ABNORMAL HIGH (ref 26.6–33.0)
MCHC: 35 g/dL (ref 31.5–35.7)
MCV: 95 fL (ref 79–97)
Monocytes Absolute: 0.6 10*3/uL (ref 0.1–0.9)
Monocytes: 7 %
Neutrophils Absolute: 5.5 10*3/uL (ref 1.4–7.0)
Neutrophils: 71 %
Platelets: 336 10*3/uL (ref 150–450)
RBC: 3.87 x10E6/uL — ABNORMAL LOW (ref 4.14–5.80)
RDW: 12.2 % (ref 11.6–15.4)
WBC: 7.8 10*3/uL (ref 3.4–10.8)

## 2020-03-05 LAB — MYASTHENIA GRAVIS FULL PANEL
AChR Binding Ab, Serum: <0.03 nmol/L (ref 0.00–0.24)
Acetylchol Block Ab: 16 % (ref 0–25)
Acetylcholine Modulat Ab: <12 % (ref 0–20)
Anti-striation Abs: NEGATIVE

## 2020-03-05 LAB — VITAMIN D 25 HYDROXY (VIT D DEFICIENCY, FRACTURES): Vit D, 25-Hydroxy: 49.5 ng/mL (ref 30.0–100.0)

## 2020-03-05 LAB — COMPREHENSIVE METABOLIC PANEL
ALT: 23 IU/L (ref 0–44)
AST: 32 IU/L (ref 0–40)
Albumin/Globulin Ratio: 1.9 (ref 1.2–2.2)
Albumin: 4.5 g/dL (ref 3.8–4.8)
Alkaline Phosphatase: 94 IU/L (ref 39–117)
BUN/Creatinine Ratio: 14 (ref 10–24)
BUN: 11 mg/dL (ref 8–27)
Bilirubin Total: 0.5 mg/dL (ref 0.0–1.2)
CO2: 26 mmol/L (ref 20–29)
Calcium: 10.2 mg/dL (ref 8.6–10.2)
Chloride: 96 mmol/L (ref 96–106)
Creatinine, Ser: 0.81 mg/dL (ref 0.76–1.27)
GFR calc Af Amer: 106 mL/min/{1.73_m2} (ref >59)
GFR calc non Af Amer: 91 mL/min/{1.73_m2} (ref >59)
Globulin, Total: 2.4 g/dL (ref 1.5–4.5)
Glucose: 102 mg/dL — ABNORMAL HIGH (ref 65–99)
Potassium: 4.2 mmol/L (ref 3.5–5.2)
Sodium: 138 mmol/L (ref 134–144)
Total Protein: 6.9 g/dL (ref 6.0–8.5)

## 2020-03-05 LAB — VITAMIN B12: Vitamin B-12: 587 pg/mL (ref 232–1245)

## 2020-03-05 LAB — HIV ANTIBODY (ROUTINE TESTING W REFLEX): HIV Screen 4th Generation wRfx: NONREACTIVE

## 2020-03-05 LAB — TSH: TSH: 2.11 u[IU]/mL (ref 0.450–4.500)

## 2020-03-05 LAB — B. BURGDORFI ANTIBODIES: Lyme IgG/IgM Ab: <0.91 {ISR} (ref 0.00–0.90)

## 2020-03-05 LAB — SEDIMENTATION RATE: Sed Rate: 6 mm/hr (ref 0–30)

## 2020-03-05 LAB — RPR: RPR Ser Ql: NONREACTIVE

## 2020-03-05 LAB — ANA W/REFLEX IF POSITIVE: Anti Nuclear Antibody (ANA): NEGATIVE

## 2020-03-05 LAB — COPPER, SERUM: Copper: 105 ug/dL (ref 69–132)

## 2020-03-05 LAB — C-REACTIVE PROTEIN: CRP: 2 mg/L (ref 0–10)

## 2020-03-05 LAB — FOLATE: Folate: 19.4 ng/mL (ref >3.0)

## 2020-03-05 LAB — ANGIOTENSIN CONVERTING ENZYME: Angio Convert Enzyme: 65 U/L (ref 14–82)

## 2020-03-12 ENCOUNTER — Telehealth: Payer: Self-pay | Admitting: Neurology

## 2020-03-12 DIAGNOSIS — G1220 Motor neuron disease, unspecified: Secondary | ICD-10-CM | POA: Insufficient documentation

## 2020-03-12 DIAGNOSIS — R531 Weakness: Secondary | ICD-10-CM

## 2020-03-12 NOTE — Telephone Encounter (Addendum)
This information was also sent through mychart. The patient's daughter read the email and understands the plan.

## 2020-03-12 NOTE — Telephone Encounter (Signed)
Please call his daughter,  I have referred him for pulmonary functional test.  May consider sleep study if he continues to have breathing difficulty at night time

## 2020-03-12 NOTE — Telephone Encounter (Signed)
Faxed Nursing Ordering to [facility] to [facility] [phone] - fax [phone]

## 2020-03-15 ENCOUNTER — Telehealth: Payer: Self-pay | Admitting: *Deleted

## 2020-03-15 NOTE — Telephone Encounter (Signed)
I called his dgt, Jeneen Rinks (on [place]). She  said his memory is so bad that she is having a difficult time getting him to understand he is sick. He is still having swallowing difficulty.  She would like a call from Dr. Terrace Arabia to discuss his declining health. She is unsure what to do about his home health referral.

## 2020-03-17 ENCOUNTER — Other Ambulatory Visit: Payer: BC Managed Care – PPO

## 2020-03-17 ENCOUNTER — Ambulatory Visit
Admission: RE | Admit: 2020-03-17 | Discharge: 2020-03-17 | Disposition: A | Discharge status: Home / Self Care | Payer: Medicare Other | Source: Ambulatory Visit | Attending: Neurology | Admitting: Neurology

## 2020-03-17 ENCOUNTER — Other Ambulatory Visit: Payer: Self-pay

## 2020-03-17 DIAGNOSIS — G1220 Motor neuron disease, unspecified: Secondary | ICD-10-CM

## 2020-03-17 DIAGNOSIS — R410 Disorientation, unspecified: Secondary | ICD-10-CM

## 2020-03-17 DIAGNOSIS — R634 Abnormal weight loss: Secondary | ICD-10-CM

## 2020-03-17 DIAGNOSIS — R531 Weakness: Secondary | ICD-10-CM | POA: Diagnosis not present

## 2020-03-19 ENCOUNTER — Telehealth: Payer: Self-pay | Admitting: Neurology

## 2020-03-19 NOTE — Telephone Encounter (Signed)
I have spoken to his dgt and provided her with the results below. She and her brother are attending the patient's appt on 03/29/20 for further discussion of test results and treatement plans.

## 2020-03-19 NOTE — Telephone Encounter (Signed)
IMPRESSION:   MRI cervical spine (without) demonstrating: -Multilevel degenerative changes with severe foraminal stenosis from C3-4 down to C6-7 as above . -No intrinsic or compressive spinal cord lesions.    IMPRESSION:   Unremarkable MRI thoracic spine (without).  No intrinsic or compressive spinal cord lesions.  Tiny disc protrusions noted at T2-3 and T4-5, without significant stenoses.    IMPRESSION:   MRI lumbar spine (without) demonstrating: - Multilevel degenerative changes from L1 down to L5, with foraminal stenoses noted as above.  This is most significant at L3-4 and L4-5,  - No spinal stenosis noted.    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

## 2020-03-19 NOTE — Telephone Encounter (Signed)
Phone calls have been made to the patient's dgt today. She has also been provided with his MRI results. They have an extended appt scheduled, on 03/29/20, with Dr. Terrace Arabia to discuss his care.

## 2020-03-19 NOTE — Telephone Encounter (Signed)
I returned the call to the patient's dgt. Says she was able to speak further with her father and feels he can hold off on medication for stress. He is still complaining of excess phlegm that is causing him to clear his throat often and bothers him when he swallows. He is not having any breathing difficulty or shortness of breath. She also sent an email stating her father said PT was planning to release him back to work this week. She said her father was also confused about this and is not returning to work.   She was inquiring about his MRI results from the cervical, thoracic and lumbar scans he completed this past weekend.

## 2020-03-19 NOTE — Telephone Encounter (Signed)
Phone calls have been made to the patient's dgt today. They have an extended appt scheduled, on 03/29/20, with Dr. Terrace Arabia to discuss his care.

## 2020-03-19 NOTE — Telephone Encounter (Signed)
Pt's daughter Jeneen Rinks on [place] called wanting to speak to RN about a concern she has. She states pt is wanting medication for stress because he feels that his throat is closing up on him and she would like to discuss this. Please advise.

## 2020-03-29 ENCOUNTER — Encounter: Payer: Self-pay | Admitting: Neurology

## 2020-03-29 ENCOUNTER — Telehealth (INDEPENDENT_AMBULATORY_CARE_PROVIDER_SITE_OTHER): Payer: Medicare Other | Admitting: Neurology

## 2020-03-29 ENCOUNTER — Ambulatory Visit: Payer: Medicare Other | Admitting: Neurology

## 2020-03-29 ENCOUNTER — Telehealth: Payer: Self-pay | Admitting: *Deleted

## 2020-03-29 ENCOUNTER — Other Ambulatory Visit: Payer: Self-pay

## 2020-03-29 VITALS — BP 120/77 | HR 74 | Temp 97.1°F | Ht 74.0 in | Wt 168.5 lb

## 2020-03-29 DIAGNOSIS — G1220 Motor neuron disease, unspecified: Secondary | ICD-10-CM

## 2020-03-29 DIAGNOSIS — R1310 Dysphagia, unspecified: Secondary | ICD-10-CM | POA: Diagnosis not present

## 2020-03-29 DIAGNOSIS — R531 Weakness: Secondary | ICD-10-CM

## 2020-03-29 MED ORDER — RILUZOLE 50 MG PO TABS: 50.0000 mg | ORAL_TABLET | Freq: Two times a day (BID) | ORAL | 11 refills | Status: DC

## 2020-03-29 NOTE — Telephone Encounter (Signed)
His copay for riluzole is $236 w/ insurance benefits. This is too expensive for him. A tier reduction request was started w/ [employer] [phone]) over the phone. OA#[id number]. Decision pending. His daughter and pharmacy have been notified.   His daughter stated they wanted to do a little more research on the medication anyway. He is not quite ready to start it yet.

## 2020-03-29 NOTE — Telephone Encounter (Signed)
Please check on the refer to the [facility], please make sure that he is on schedule

## 2020-03-29 NOTE — Progress Notes (Signed)
PATIENT: Donald Washington DOB: [birth year]  Chief Complaint  Patient presents with  . Motor Neuron Disease    He is here with his children, Caren Griffins and Gaspar Bidding, for further discussion of his diagnosis.     HISTORICAL  DONTAY HARM is a 69 year old male, seen in request by neurosurgeon Dr. Sherley Bounds for evaluation of gait abnormality, muscle loss, rapid weight loss, initial evaluation was with his daughter Caren Griffins on February 28, 2020   I have reviewed and summarized the referring note from the referring physician.  He had past medical history of hypertension, hyperlipidemia, depression, taking Effexor 150 mg daily for many years, used to work as a Database administrator.  He has lived by himself for many years, when he visit his daughter in Jan 2020, there was no significant abnormality found  He apparently had subacute onset gait abnormality, fell in July 2020, with right rib fracture, since then, he was noted to have rapid decline, he had left foot drop, difficulty clear his left foot from the floor, 2 months later by September 2020, he was noted to have right foot drop, he was seen by neurosurgeon Dr. 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, he denies difficulty handling his job at that time, he was not able to go back to his work since his rib fracture on July 05, 2020, got confused easily, does not know how to pay his bill, daughter also noticed some personality change, he used to be very independent, now he has mellowed down, is more willing to accept help  In addition, he was noted to have 50 pound weight loss from February to June 2020, he denies swallowing difficulty, but complains of thick secretion at the back of his throat, lack of appetite, he denies droopy  eyelid, double vision, he denies significant sensory loss, no bowel and bladder incontinence,  MRI of lumbar spine at [facility] on August 26, 2019 report, 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, right cord asymmetric disc osteophyte, causing mild canal stenosis, right foraminal narrowing at 2013, L3 and 4 hemilaminectomy with well-maintained central canal, right foraminal disc osteophyte cause severe neural foraminal narrowing, levoscoliosis,  CT of chest, abdomen, pelvic November 2020, no evidence of malignancy, coronary artery calcification and aortic atherosclerosis, degenerative changes of the lumbar spine  MRI of the brain report from [place] [facility] on January 03, 2020, no acute abnormality, mild small vessel disease in basal ganglia  UPDATE March 01 2020: He return for electrodiagnostic study today, which showed evidence of widespread chronic neuropathic changes involving right orbicularis oculi, right sternocleidomastoid, right cervical myotomes and bilateral lumbosacral myotomes.  I was not able to appreciate any spontaneous activity in the right thoracic paraspinal muscles, or right cervical, lumbar sacral paraspinal muscles.  In addition, there is evidence of mild axonal peripheral neuropathy, bilateral carpal tunnel syndromes.  Laboratory evaluation on February 28, 2020 showed normal negative W43, RPR, HIV, folic acid, C-reactive protein, TSH, CPK, ESR, ANA, copper, angiotensin-converting enzyme, vitamin D, Lyme titer, CMP, CBC showed no significant abnormality.  He was noted to have normal reflexes, likely bilateral Babinski signs, rare fasciculation on needle examinations, above findings raised the possibility of motor neuron disease, laboratory evaluation, and previous imaging studies of CT chest, abdomen, pelvic,  lumbar spine have rule out the possibility of nutritional deficiency, infection, inflammatory process, no evidence of  neoplasm.  UPDATE March 29 2020: He is accompanied by his daughter Caren Griffins and his son Gaspar Bidding at today's visit, reported that he was noted to have mild cognitive impairment since January 2020, he got lost driving to her daughter's house, he continue have progressive worsening bilateral upper and lower extremity weakness, mild gait abnormality, he was noted to have increased confusion, difficult to keep the information in,  He still lives alone, having breakfast outside every morning, driving to different places without getting lost  We had personally reviewed extensive laboratory evaluations and films  MRI of the brain showed mild generalized atrophy, mild supratentorium small vessel disease MRI of cervical spine, multilevel degenerative changes with severe foraminal stenosis from C3-4 down to C6-7, no evidence of intrinsic cord lesion MRI of thoracic spine showed no acute abnormality. MRI of lumbar spine multilevel degenerative changes from L1-L5, with variable degree of foraminal narrowing, most severe at 34, L4-5 moderate to severe left foraminal narrowing, no evidence of spinal cord compression  Again extensive laboratory evaluation showed no treatable etiology  REVIEW OF SYSTEMS: Full 14 system review of systems performed and notable only for as above All other review of systems were negative.  ALLERGIES: No Known Allergies  HOME MEDICATIONS: Current Outpatient Medications  Medication Sig Dispense Refill  . aspirin EC 81 MG tablet Take 81 mg by mouth daily.    Marland Kitchen atorvastatin (LIPITOR) 80 MG tablet Take 80 mg by mouth daily.     . valsartan-hydrochlorothiazide (DIOVAN-HCT) 320-25 MG tablet Take 1 tablet by mouth daily.     Marland Kitchen venlafaxine XR (EFFEXOR-XR) 150 MG 24 hr capsule Take 150 mg by mouth daily.      No current facility-administered medications for this visit.    PAST MEDICAL HISTORY: Past Medical History:  Diagnosis Date  . Foot drop    bilateral  . Generalized weakness    . Hyperlipemia   . Hypertension   . Memory changes   . Muscle weakness   . Weight loss, unintentional     PAST SURGICAL HISTORY: Past Surgical History:  Procedure Laterality Date  . BACK SURGERY     low back - 80's  . PERONEAL NERVE DECOMPRESSION Bilateral   . SHOULDER SURGERY Right    rotator cuff - 90's    FAMILY HISTORY: Family History  Problem Relation Age of Onset  . Pneumonia Mother   . Esophageal cancer Father     SOCIAL HISTORY: Social History   Socioeconomic History  . Marital status: Divorced    Spouse name: Not on file  . Number of children: 2  . Years of education: 13  . Highest education level: High school graduate  Occupational History  . Occupation: Media planner  Tobacco Use  . Smoking status: Former Research scientist (life sciences)  . Smokeless tobacco: Never Used  Substance and Sexual Activity  . Alcohol use: Yes    Comment: 02/28/20 - He was having 2 drinks per night. None in 2 weeks.  . Drug use: Never  . Sexual activity: Never  Other Topics Concern  . Not on file  Social History Narrative   Lives alone.   Right-handed.   Caffeine use: 2 cups per day.   Social Determinants of Health   Financial Resource Strain:   . Difficulty of Paying Living Expenses:   Food Insecurity:   . Worried About Charity fundraiser in the Last Year:   .  Ran Out of Food in the Last Year:   Transportation Needs:   . Film/video editor (Medical):   Marland Kitchen Lack of Transportation (Non-Medical):   Physical Activity:   . Days of Exercise per Week:   . Minutes of Exercise per Session:   Stress:   . Feeling of Stress :   Social Connections:   . Frequency of Communication with Friends and Family:   . Frequency of Social Gatherings with Friends and Family:   . Attends Religious Services:   . Active Member of Clubs or Organizations:   . Attends Archivist Meetings:   Marland Kitchen Marital Status:   Intimate Partner Violence:   . Fear of Current or Ex-Partner:   . Emotionally  Abused:   Marland Kitchen Physically Abused:   . Sexually Abused:      PHYSICAL EXAM   Vitals:   03/29/20 1226  BP: 120/77  Pulse: 74  Temp: (!) 97.1 F (36.2 C)  Weight: 168 lb 8 oz (76.4 kg)  Height: _0  (1.88 m)    Not recorded      Body mass index is 21.63 kg/m.  PHYSICAL EXAMNIATION:  Gen: NAD, conversant, well nourised, well groomed                     Cardiovascular: Regular rate rhythm, no peripheral edema, warm, nontender. Eyes: Conjunctivae clear without exudates or hemorrhage Neck: Supple, no carotid bruits. Pulmonary: Clear to auscultation bilaterally   NEUROLOGICAL EXAM: MMSE - Mini Mental State Exam 02/28/2020  Orientation to time 5  Orientation to Place 4  Registration 3  Attention/ Calculation 5  Recall 0  Language- name 2 objects 2  Language- repeat 1  Language- follow 3 step command 3  Language- read & follow direction 1  Write a sentence 1  Copy design 1  Total score 26  animal naming 6.   CRANIAL NERVES: CN II: Visual fields are full to confrontation. Pupils are round equal and briskly reactive to light. CN III, IV, VI: extraocular movement are normal.  Cover and uncover testing showed mild bilateral medial rectus weakness, no ptosis. CN V: Facial sensation is intact to light touch CN VII: Face is symmetric, he has mild bilateral eye closure, cheek puff weakness, CN VIII: Hearing is normal to causal conversation. CN IX, X: Phonation is normal. CN XI: Head turning and shoulder shrug are intact, normal tongue movement, no atrophy, normal tongue movement CN XII: No tongue atrophy, fasciculations, normal tongue movement, strength,  MOTOR: normal muscle tone, I was not able to appreciate any muscle fasciculations, he has mild neck flexion, bilateral shoulder abduction, external flexion, elbow flexion, extension weakness, mild bilateral hip flexion, knee extension weakness, bilateral ankle dorsiflexion weakness, left side is moderate, right side is mild to  moderate, mild to moderate bilateral ankle eversion, inversion weakness  REFLEXES: Reflexes are 1 and symmetric at the biceps, triceps, knees, and absent at ankles. Plantar responses are extensor bilaterally  SENSORY: Normal vibratory sensation, light touch, pinprick.     COORDINATION: There is no trunk or limb dysmetria noted.  GAIT/STANCE: Needs push-up to get up from seated position, mildly unsteady, dragging both leg, could not stand up on heels, could stand on tiptoe.   DIAGNOSTIC DATA (LABS, IMAGING, TESTING) - I reviewed patient records, labs, notes, testing and imaging myself where available.   ASSESSMENT AND PLAN  HISASHI AMADON is a 69 y.o. male   Motor neuron disease with frontotemporal dementia  Patient had  rapid progression cognitive malfunction followed by weakness since January 2020  On examination, he has mild eye closure, cheek puff weakness, mild bilateral proximal upper and lower extremity muscle weakness, moderate bilateral ankle dorsiflexion weakness normal reflexes with bilateral Babinski signs  EMG nerve conduction study demonstrate widespread chronic neuropathic changes involving right cervical, bilateral lumbosacral myotomes, mild degree of right bulbar muscles.  MRI of neuraxis, extensive laboratories studies, CT of the chest, abdomen pelvic failed to demonstrate treatable etiologies.  Above findings most consistent with motor neuron disease with frontotemporal dementia  We had extensive discussion about grim long-term prognosis, encouraged him to make long-term decisions about his personal care soon.  Will also refer him to pulmonary functional test, and swallowing study for baseline,  Have referred [facility] to [facility]  Total time spent reviewing the chart, obtaining history, examined patient, ordering tests, documentation, consultations and family, care coordination was 60 minutes

## 2020-04-02 NOTE — Telephone Encounter (Signed)
We received a denial for a tier reduction from [facility].  However, the medication would be much less expensive by using [facility] vs his insurance.  As of today the prices for riuzole 50mg , one tab BID are as follows:  1) [facility]: #60 - $40.37, #180 - $95.60  2) : #60 - $38.60, #180 - $102.33  His insurance co-pay is $236 for a month supply.  I have called his daughter, Karin Golden (on [address]). Left message requesting her to call me back.

## 2020-04-02 NOTE — Telephone Encounter (Signed)
I called and spoke to patient's daughter and relayed patient's apt times in May . Patient is scheduled May 3 rd and May 4 4th .

## 2020-04-02 NOTE — Telephone Encounter (Signed)
I spoke to the patient's daughter. Her father is not ready to start the medication yet but she plans to talk with him more. If he is willing to take it, she will call our office back and advise which pharmacy he would like to use.

## 2020-04-05 ENCOUNTER — Telehealth: Payer: Self-pay | Admitting: Neurology

## 2020-04-05 ENCOUNTER — Other Ambulatory Visit (HOSPITAL_COMMUNITY): Payer: Self-pay

## 2020-04-05 DIAGNOSIS — R1310 Dysphagia, unspecified: Secondary | ICD-10-CM

## 2020-04-05 NOTE — Telephone Encounter (Signed)
I finished the letter, send out.

## 2020-04-05 NOTE — Telephone Encounter (Addendum)
I spoke to the patient's daughter and she would like the letter and [employer] form mailed to her home address:  [address] [place], [place] [address]  Placed in mail bin today.

## 2020-04-06 ENCOUNTER — Other Ambulatory Visit (HOSPITAL_COMMUNITY)
Admission: RE | Admit: 2020-04-06 | Discharge: 2020-04-06 | Disposition: A | Discharge status: Home / Self Care | Payer: Medicare Other | Source: Ambulatory Visit | Attending: Neurology | Admitting: Neurology

## 2020-04-06 DIAGNOSIS — Z20822 Contact with and (suspected) exposure to covid-19: Secondary | ICD-10-CM | POA: Insufficient documentation

## 2020-04-06 DIAGNOSIS — Z01812 Encounter for preprocedural laboratory examination: Secondary | ICD-10-CM | POA: Insufficient documentation

## 2020-04-06 LAB — SARS CORONAVIRUS 2 (TAT 6-24 HRS): SARS Coronavirus 2: NEGATIVE

## 2020-04-07 NOTE — Progress Notes (Signed)
Donald Washington:  Covid-19 antibody was negative  Levert Feinstein, M.D. Ph.D.  [facility] [address] [place], [place] [address] Phone: [phone] Fax:      [phone]

## 2020-04-09 ENCOUNTER — Ambulatory Visit (INDEPENDENT_AMBULATORY_CARE_PROVIDER_SITE_OTHER): Payer: Medicare Other | Admitting: Internal Medicine

## 2020-04-09 ENCOUNTER — Other Ambulatory Visit: Payer: Self-pay

## 2020-04-09 DIAGNOSIS — R531 Weakness: Secondary | ICD-10-CM

## 2020-04-09 DIAGNOSIS — G1220 Motor neuron disease, unspecified: Secondary | ICD-10-CM

## 2020-04-09 LAB — PULMONARY FUNCTION TEST
DL/VA % pred: 98 %
DL/VA: 3.94 ml/min/mmHg/L
DLCO unc % pred: 71 %
DLCO unc: 21.2 ml/min/mmHg
FEF 25-75 Post: 1.43 L/sec
FEF 25-75 Pre: 1.09 L/sec
FEF2575-%Change-Post: 31 %
FEF2575-%Pred-Post: 48 %
FEF2575-%Pred-Pre: 36 %
FEV1-%Change-Post: 15 %
FEV1-%Pred-Post: 61 %
FEV1-%Pred-Pre: 53 %
FEV1-Post: 2.39 L
FEV1-Pre: 2.08 L
FEV1FVC-%Change-Post: 14 %
FEV1FVC-%Pred-Pre: 72 %
FEV6-%Change-Post: 0 %
FEV6-%Pred-Post: 78 %
FEV6-%Pred-Pre: 78 %
FEV6-Post: 3.86 L
FEV6-Pre: 3.86 L
FEV6FVC-%Change-Post: 0 %
FEV6FVC-%Pred-Post: 105 %
FEV6FVC-%Pred-Pre: 105 %
FVC-%Change-Post: 0 %
FVC-%Pred-Post: 74 %
FVC-%Pred-Pre: 74 %
FVC-Post: 3.87 L
FVC-Pre: 3.86 L
Post FEV1/FVC ratio: 62 %
Post FEV6/FVC ratio: 100 %
Pre FEV1/FVC ratio: 54 %
Pre FEV6/FVC Ratio: 100 %
RV % pred: 77 %
RV: 2.03 L
TLC % pred: 75 %
TLC: 5.95 L

## 2020-04-09 NOTE — Progress Notes (Signed)
PFT completed today.  

## 2020-04-12 ENCOUNTER — Telehealth: Payer: Self-pay | Admitting: Neurology

## 2020-04-12 DIAGNOSIS — R0600 Dyspnea, unspecified: Secondary | ICD-10-CM

## 2020-04-12 DIAGNOSIS — G1220 Motor neuron disease, unspecified: Secondary | ICD-10-CM

## 2020-04-12 NOTE — Telephone Encounter (Signed)
Pt daughter called to check on the pts pulmonary functions test

## 2020-04-13 ENCOUNTER — Other Ambulatory Visit: Payer: Self-pay

## 2020-04-13 ENCOUNTER — Ambulatory Visit (HOSPITAL_COMMUNITY)
Admission: RE | Admit: 2020-04-13 | Discharge: 2020-04-13 | Disposition: A | Discharge status: Home / Self Care | Payer: Medicare Other | Source: Ambulatory Visit | Attending: Neurology | Admitting: Neurology

## 2020-04-13 DIAGNOSIS — R1310 Dysphagia, unspecified: Secondary | ICD-10-CM | POA: Insufficient documentation

## 2020-04-13 DIAGNOSIS — G1220 Motor neuron disease, unspecified: Secondary | ICD-10-CM

## 2020-04-16 ENCOUNTER — Telehealth: Payer: Self-pay | Admitting: Neurology

## 2020-04-16 ENCOUNTER — Encounter: Payer: Self-pay | Admitting: *Deleted

## 2020-04-17 DIAGNOSIS — R0600 Dyspnea, unspecified: Secondary | ICD-10-CM | POA: Insufficient documentation

## 2020-04-17 NOTE — Telephone Encounter (Signed)
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

## 2020-04-17 NOTE — Addendum Note (Signed)
Addended by: Levert Feinstein on: 04/17/2020 05:12 PM   Modules accepted: Orders

## 2020-04-18 NOTE — Telephone Encounter (Signed)
Referral Sent

## 2020-04-20 NOTE — Telephone Encounter (Signed)
Open in error

## 2020-04-26 ENCOUNTER — Telehealth: Payer: Self-pay | Admitting: Neurology

## 2020-04-26 NOTE — Telephone Encounter (Signed)
38 Dr Alain Honey office has called stating that Dr Denny Peon is requesting a peer to peer with Dr Terrace Arabia to discuss any restrictions that Dr Terrace Arabia may have the pt on, this is re: a disability claim.  Please call [phone]

## 2020-04-26 NOTE — Telephone Encounter (Signed)
I returned the call to Dr. Alain Honey office. He was not available. The physician's line asked that a message be left and the doctor would return the call for the peer-to-peer discussion. No time was provided but I shared with them our office hours. I provided our office number and Dr. Zannie Cove mobile number for the physician to call.

## 2020-04-27 NOTE — Telephone Encounter (Signed)
40 Dr Alain Honey office has called back stating exactly what was stated by him on 04-29.  The message from [place], [place] was read to him and when asked if Dr. Denny Peon tried Dr Terrace Arabia on her cell# he said he was unsure.  Cloretta Ned stated he was asked to call again. Please call [phone].  Cloretta Ned was made aware that the office will not reopen until Monday morning.

## 2020-04-30 NOTE — Telephone Encounter (Signed)
I called and left another message requesting a call back. Provided Dr. Zannie Cove mobile and office numbers for the physician to call to discuss this patient. Also, provided our operating hours.

## 2020-05-01 NOTE — Telephone Encounter (Signed)
I returned the call again to the physician line and spoke to [facility]. Their office is open from 9-5 PST. They will need [place] to call them back with the doctor is in office.

## 2020-05-01 NOTE — Telephone Encounter (Signed)
I was able to talk Donald Washington, assistant of Dr. Dr Denny Peon, independent medical review company for patient applying for disabilities.  Patient was diagnosed with motor neuron disease with frontotemporal dementia, Mini-Mental Status was 26/30, family also reported personality change, lack of insight of his disease, short-term memory loss, there was evidence of neck flexion weakness, bilateral upper and lower extremity weakness, unsteady gait  Patient has rapid progression of his motor neuron disease, with additional features of frontotemporal dementia, I do not think he can maintain any meaningful employment.

## 2020-05-01 NOTE — Telephone Encounter (Signed)
Left another message requesting a call back.

## 2020-05-10 ENCOUNTER — Institutional Professional Consult (permissible substitution): Payer: Medicare Other | Admitting: Internal Medicine

## 2020-05-21 ENCOUNTER — Telehealth: Payer: Self-pay | Admitting: Neurology

## 2020-05-21 NOTE — Telephone Encounter (Signed)
SPOKE WITH Donald Washington DAUGHTER AND SHE STATED THAT HE IS NOT READY FOR HOME HEALTH AT THIS TIME. 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 SHE WILL LET DR. YAN KNOW IF THEY DECIDE THEY WANT TO ACCEPT HH IN THE FUTURE.  [facility]

## 2020-06-11 ENCOUNTER — Telehealth: Payer: Self-pay | Admitting: Neurology

## 2020-06-11 NOTE — Telephone Encounter (Signed)
Hi Dr. Montey Hora,  Would it be possible for one of you to call me regarding my father?  He is requesting a "do not resuscitate" letter for his house and I was told only a doctor can provide one. He refuses to see any other doctor. Please call me when you have a moment.   Thank you,  [place]  [phone]  Please call, it is ok for me to sign DNR orders, but we had never done this before at office, please make we clear about the process

## 2020-06-12 NOTE — Telephone Encounter (Signed)
I have returned the call to the patient's daughter on [place]. States he has continued to decline. He has lost around 25 more pounds. He has refused the appt w/ [facility]. He has refused a Hospice evaluation. He is only eating a small amount of eggs and bacon each morning then not having anything else for the remainder of the day. I spoke with her concerning the requested DNR. She is actually going to speak to his PCP about the form. Says she has been trying to coax him into going for a check-up and this will be a good way to get him there for an appt. If she runs into any issues, she will call me back. He plans to keep his pending appt here on 07/10/20.

## 2020-06-13 ENCOUNTER — Emergency Department (HOSPITAL_COMMUNITY)
Admission: EM | Admit: 2020-06-13 | Discharge: 2020-06-13 | Discharge status: Against Medical Advice | Payer: Medicare Other

## 2020-06-13 NOTE — ED Notes (Signed)
Pt leaving. States he'll follow up with his primary care provider.

## 2020-07-10 ENCOUNTER — Encounter: Payer: Self-pay | Admitting: Neurology

## 2020-07-10 ENCOUNTER — Ambulatory Visit: Payer: Medicare Other | Admitting: Neurology

## 2020-07-10 VITALS — BP 139/90 | HR 72 | Ht 74.0 in | Wt 148.0 lb

## 2020-07-10 DIAGNOSIS — R4189 Other symptoms and signs involving cognitive functions and awareness: Secondary | ICD-10-CM

## 2020-07-10 DIAGNOSIS — G1220 Motor neuron disease, unspecified: Secondary | ICD-10-CM

## 2020-07-10 DIAGNOSIS — R269 Unspecified abnormalities of gait and mobility: Secondary | ICD-10-CM | POA: Insufficient documentation

## 2020-07-10 MED ORDER — MIRTAZAPINE 15 MG PO TABS: 15.0000 mg | ORAL_TABLET | Freq: Every day | ORAL | 11 refills | Status: DC

## 2020-07-10 NOTE — Progress Notes (Signed)
PATIENT: Donald Washington DOB: 01/12/1951  Chief Complaint  Patient presents with  . Follow-up    pt with daughter, back hall. follow up visit ALS. pt admits there has been increase weakness and weight loss. daughter states that he has hard time with swallowing. pt has seen GI specialist and they cleared, pulmonologist and he denies following up. refused the referral to [facility]. PCP wanted to get hospice on board and he has declined that as well. daughter states he doesnt want to prolong anything but at same time he is ok right now     HISTORICAL  Donald Washington is a 69 year old male, seen in request by neurosurgeon Dr. Sherley Bounds for evaluation of gait abnormality, muscle loss, rapid weight loss, initial evaluation was with his daughter Donald Washington on February 28, 2020   I have reviewed and summarized the referring note from the referring physician.  He had past medical history of hypertension, hyperlipidemia, depression, taking Effexor 150 mg daily for many years, used to work as a Database administrator.  He has lived by himself for many years, when he visit his daughter in Jan 2020, there was no significant abnormality found  He apparently had subacute onset gait abnormality, fell in July 2020, with right rib fracture, since then, he was noted to have rapid decline, he had left foot drop, difficulty clear his left foot from the floor, 2 months later by September 2020, he was noted to have right foot drop, he was seen by neurosurgeon Dr. 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, he denies difficulty handling his job at that time, he was not able to go back to his work since his rib fracture on July 05, 2020, got confused easily, does not know how to pay his bill, daughter also  noticed some personality change, he used to be very independent, now he has mellowed down, is more willing to accept help  In addition, he was noted to have 50 pound weight loss from February to June 2020, he denies swallowing difficulty, but complains of thick secretion at the back of his throat, lack of appetite, he denies droopy eyelid, double vision, he denies significant sensory loss, no bowel and bladder incontinence,  MRI of lumbar spine at [facility] on August 26, 2019 report, 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, right cord asymmetric disc osteophyte, causing mild canal stenosis, right foraminal narrowing at 2013, L3 and 4 hemilaminectomy with well-maintained central canal, right foraminal disc osteophyte cause severe neural foraminal narrowing, levoscoliosis,  CT of chest, abdomen, pelvic November 2020, no evidence of malignancy, coronary artery calcification and aortic atherosclerosis, degenerative changes of the lumbar spine  MRI of the brain report from [facility] on January 03, 2020, no acute abnormality, mild small vessel disease in basal ganglia  UPDATE March 01 2020: He return for electrodiagnostic study today, which showed evidence of widespread chronic neuropathic changes involving right orbicularis oculi, right sternocleidomastoid, right cervical myotomes and bilateral lumbosacral myotomes.  I was not able to appreciate any spontaneous activity in the right thoracic paraspinal muscles, or right cervical, lumbar sacral paraspinal muscles.  In addition, there is evidence of mild axonal peripheral neuropathy, bilateral carpal tunnel syndromes.  Laboratory evaluation on February 28, 2020 showed normal negative  Z70, RPR, HIV, folic acid, C-reactive protein, TSH, CPK, ESR, ANA, copper, angiotensin-converting enzyme, vitamin D, Lyme titer, CMP, CBC showed no significant abnormality.  He was noted to have normal reflexes, likely bilateral  Babinski signs, rare fasciculation on needle examinations, above findings raised the possibility of motor neuron disease, laboratory evaluation, and previous imaging studies of CT chest, abdomen, pelvic, lumbar spine have rule out the possibility of nutritional deficiency, infection, inflammatory process, no evidence of neoplasm.  UPDATE March 29 2020: He is accompanied by his daughter Donald Washington and his son Donald Washington at today's visit, reported that he was noted to have mild cognitive impairment since January 2020, he got lost driving to her daughter's house, he continue have progressive worsening bilateral upper and lower extremity weakness, mild gait abnormality, he was noted to have increased confusion, difficult to keep the information in,  He still lives alone, having breakfast outside every morning, driving to different places without getting lost  We had personally reviewed extensive laboratory evaluations and films  MRI of the brain showed mild generalized atrophy, mild supratentorium small vessel disease MRI of cervical spine, multilevel degenerative changes with severe foraminal stenosis from C3-4 down to C6-7, no evidence of intrinsic cord lesion MRI of thoracic spine showed no acute abnormality. MRI of lumbar spine multilevel degenerative changes from L1-L5, with variable degree of foraminal narrowing, most severe at 34, L4-5 moderate to severe left foraminal narrowing, no evidence of spinal cord compression  Again extensive laboratory evaluation showed no treatable etiology  UPDATE July 10 2020: He is accompanied by his daughter Donald Washington at today's visit, he continues to decline, no memory loss, drink alcohol excessively, reported significant abnormal liver functional tests, continue have worsening memory loss.  REVIEW OF SYSTEMS: Full 14 system review of systems performed and notable only for as above All other review of systems were negative.  ALLERGIES: No Known Allergies  HOME  MEDICATIONS: Current Outpatient Medications  Medication Sig Dispense Refill  . hydrOXYzine (ATARAX/VISTARIL) 25 MG tablet Take 25 mg by mouth daily.    Marland Kitchen venlafaxine XR (EFFEXOR-XR) 150 MG 24 hr capsule Take 150 mg by mouth daily.      No current facility-administered medications for this visit.    PAST MEDICAL HISTORY: Past Medical History:  Diagnosis Date  . Foot drop    bilateral  . Generalized weakness   . Hyperlipemia   . Hypertension   . Memory changes   . Muscle weakness   . Weight loss, unintentional     PAST SURGICAL HISTORY: Past Surgical History:  Procedure Laterality Date  . BACK SURGERY     low back - 80's  . PERONEAL NERVE DECOMPRESSION Bilateral   . SHOULDER SURGERY Right    rotator cuff - 90's    FAMILY HISTORY: Family History  Problem Relation Age of Onset  . Pneumonia Mother   . Esophageal cancer Father     SOCIAL HISTORY: Social History   Socioeconomic History  . Marital status: Divorced    Spouse name: Not on file  . Number of children: 2  . Years of education: 60  . Highest education level: High school graduate  Occupational History  . Occupation: Media planner  Tobacco Use  . Smoking status: Former Research scientist (life sciences)  . Smokeless tobacco: Never Used  Substance and Sexual Activity  . Alcohol use: Yes    Comment: 02/28/20 - He was having 2 drinks per night. None in 2 weeks.  . Drug use: Never  . Sexual activity: Never  Other Topics Concern  . Not on file  Social History Narrative   Lives alone.   Right-handed.   Caffeine use: 2 cups per day.   Social Determinants of Health   Financial Resource Strain:   . Difficulty of Paying Living Expenses:   Food Insecurity:   . Worried About Charity fundraiser in the Last Year:   . Arboriculturist in the Last Year:   Transportation Needs:   . Film/video editor (Medical):   Marland Kitchen Lack of Transportation (Non-Medical):   Physical Activity:   . Days of Exercise per Week:   . Minutes of Exercise  per Session:   Stress:   . Feeling of Stress :   Social Connections:   . Frequency of Communication with Friends and Family:   . Frequency of Social Gatherings with Friends and Family:   . Attends Religious Services:   . Active Member of Clubs or Organizations:   . Attends Archivist Meetings:   Marland Kitchen Marital Status:   Intimate Partner Violence:   . Fear of Current or Ex-Partner:   . Emotionally Abused:   Marland Kitchen Physically Abused:   . Sexually Abused:      PHYSICAL EXAM   Vitals:   07/10/20 1257  BP: 139/90  Pulse: 72  Weight: 148 lb (67.1 kg)  Height: '6\' 2"'  (1.88 m)   Not recorded     Body mass index is 19 kg/m.  PHYSICAL EXAMNIATION:  Gen: NAD, conversant, well nourised, well groomed                     Cardiovascular: Regular rate rhythm, no peripheral edema, warm, nontender. Eyes: Conjunctivae clear without exudates or hemorrhage Neck: Supple, no carotid bruits. Pulmonary: Clear to auscultation bilaterally   NEUROLOGICAL EXAM: MMSE - Mini Mental State Exam 02/28/2020  Orientation to time 5  Orientation to Place 4  Registration 3  Attention/ Calculation 5  Recall 0  Language- name 2 objects 2  Language- repeat 1  Language- follow 3 step command 3  Language- read & follow direction 1  Write a sentence 1  Copy design 1  Total score 26  animal naming 6.  Montreal Cognitive Assessment  07/10/2020  Visuospatial/ Executive (0/5) 4  Naming (0/3) 3  Attention: Read list of digits (0/2) 2  Attention: Read list of letters (0/1) 1  Attention: Serial 7 subtraction starting at 100 (0/3) 0  Language: Repeat phrase (0/2) 2  Language : Fluency (0/1) 0  Abstraction (0/2) 2  Delayed Recall (0/5) 0  Orientation (0/6) 2  Total 16     CRANIAL NERVES: CN II: Visual fields are full to confrontation. Pupils are round equal and briskly reactive to light. CN III, IV, VI: extraocular movement are normal.  Cover and uncover testing showed mild bilateral medial rectus  weakness, no ptosis. CN V: Facial sensation is intact to light touch CN VII: Face is symmetric, he has mild bilateral eye closure, cheek puff weakness, CN VIII: Hearing is normal to causal conversation. CN IX, X: Phonation is normal. CN XI: Head turning and shoulder shrug are intact, normal tongue movement, no atrophy, normal tongue movement CN XII: No tongue atrophy, fasciculations, normal tongue movement, strength,  MOTOR: normal muscle tone, I was not able to appreciate any muscle fasciculations, he has mild neck flexion, bilateral shoulder abduction, external flexion, elbow flexion, extension weakness, mild bilateral hip flexion weakness, mild to moderate bilateral ankle dorsiflexion weakness  REFLEXES: Reflexes  are 1 and symmetric at the biceps, triceps, knees, and absent at ankles. Plantar responses are extensor bilaterally  SENSORY: Normal vibratory sensation, light touch, pinprick.     COORDINATION: There is no trunk or limb dysmetria noted.  GAIT/STANCE: He can get up from seated position arms crossed by 2 attempts,, mildly unsteady, dragging both leg, could not stand up on heels, could stand on tiptoe.   DIAGNOSTIC DATA (LABS, IMAGING, TESTING) - I reviewed patient records, labs, notes, testing and imaging myself where available.   ASSESSMENT AND PLAN  KEO SCHIRMER is a 69 y.o. male   Motor neuron disease with frontotemporal dementia  Patient had rapid progression cognitive malfunction followed by weakness since January 2020  On examination, he has mild eye closure, cheek puff weakness, mild bilateral proximal upper and lower extremity muscle weakness, moderate bilateral ankle dorsiflexion weakness normal reflexes with bilateral Babinski signs  EMG nerve conduction study demonstrate widespread chronic neuropathic changes involving right cervical, bilateral lumbosacral myotomes, mild degree of right bulbar muscles.  MRI of neuraxis, extensive laboratories studies, CT of  the chest, abdomen pelvic failed to demonstrate treatable etiologies.  Above findings most consistent with motor neuron disease with frontotemporal dementia  We had extensive discussion about grim long-term prognosis, encouraged him to make long-term decisions about his personal care soon.  Will also refer him to pulmonary functional test, and swallowing study for baseline,  He lives alone, we also spent time discussing long-term care plan, I have signed DNR paperwork for him, he preferred to stay home  I have referred him to [place] [facility], he does not want to proceed at this point,  Total time spent reviewing the chart, obtaining history, examined patient, ordering tests, documentation, consultations and family, care coordination was 21 minutes     Marcial Pacas, M.D. Ph.D.  [facility] [place], [place] [address] Phone: [phone] Fax:      [phone]

## 2020-08-01 ENCOUNTER — Other Ambulatory Visit: Payer: Self-pay | Admitting: Neurology

## 2020-10-08 DIAGNOSIS — G1220 Motor neuron disease, unspecified: Secondary | ICD-10-CM

## 2020-10-08 NOTE — Telephone Encounter (Signed)
I have put in urgent refer per family request  Orders Placed This Encounter  Procedures  . Ambulatory referral to Neurology

## 2021-01-08 DIAGNOSIS — G1221 Amyotrophic lateral sclerosis: Secondary | ICD-10-CM | POA: Diagnosis not present

## 2021-01-08 DIAGNOSIS — R488 Other symbolic dysfunctions: Secondary | ICD-10-CM | POA: Diagnosis not present

## 2021-01-14 ENCOUNTER — Ambulatory Visit: Payer: Medicare Other | Admitting: Neurology

## 2021-01-15 ENCOUNTER — Ambulatory Visit: Payer: Medicare Other | Admitting: Neurology

## 2021-01-15 DIAGNOSIS — R488 Other symbolic dysfunctions: Secondary | ICD-10-CM | POA: Diagnosis not present

## 2021-01-15 DIAGNOSIS — G1221 Amyotrophic lateral sclerosis: Secondary | ICD-10-CM | POA: Diagnosis not present

## 2021-01-21 DIAGNOSIS — H2513 Age-related nuclear cataract, bilateral: Secondary | ICD-10-CM | POA: Diagnosis not present

## 2021-01-21 DIAGNOSIS — H524 Presbyopia: Secondary | ICD-10-CM | POA: Diagnosis not present

## 2021-01-22 DIAGNOSIS — G1221 Amyotrophic lateral sclerosis: Secondary | ICD-10-CM | POA: Diagnosis not present

## 2021-01-22 DIAGNOSIS — R488 Other symbolic dysfunctions: Secondary | ICD-10-CM | POA: Diagnosis not present

## 2021-01-30 DIAGNOSIS — R488 Other symbolic dysfunctions: Secondary | ICD-10-CM | POA: Diagnosis not present

## 2021-01-30 DIAGNOSIS — G1221 Amyotrophic lateral sclerosis: Secondary | ICD-10-CM | POA: Diagnosis not present

## 2021-02-08 DIAGNOSIS — R488 Other symbolic dysfunctions: Secondary | ICD-10-CM | POA: Diagnosis not present

## 2021-02-08 DIAGNOSIS — G1221 Amyotrophic lateral sclerosis: Secondary | ICD-10-CM | POA: Diagnosis not present

## 2021-02-14 DIAGNOSIS — R488 Other symbolic dysfunctions: Secondary | ICD-10-CM | POA: Diagnosis not present

## 2021-02-14 DIAGNOSIS — G1221 Amyotrophic lateral sclerosis: Secondary | ICD-10-CM | POA: Diagnosis not present

## 2021-02-19 DIAGNOSIS — R488 Other symbolic dysfunctions: Secondary | ICD-10-CM | POA: Diagnosis not present

## 2021-02-19 DIAGNOSIS — G1221 Amyotrophic lateral sclerosis: Secondary | ICD-10-CM | POA: Diagnosis not present

## 2021-02-25 DIAGNOSIS — R488 Other symbolic dysfunctions: Secondary | ICD-10-CM | POA: Diagnosis not present

## 2021-02-25 DIAGNOSIS — G1221 Amyotrophic lateral sclerosis: Secondary | ICD-10-CM | POA: Diagnosis not present

## 2021-03-07 IMAGING — RF DG UGI W/ HIGH DENSITY W/O KUB
5 of 6 series · 14 of 24 positions shown · non-contrast
Comparison: 07/07/2011 CT abdomen/pelvis.

CLINICAL DATA: Early satiety for 6 months with weight loss.

EXAM:
UPPER GI SERIES WITH KUB
TECHNIQUE: After obtaining a scout radiograph a routine upper GI series was
performed using thin and high density barium.
FLUOROSCOPY TIME:  Fluoroscopy Time:  3 minutes 12 second
Radiation Exposure Index (if provided by the fluoroscopic device):
240 mGy
Number of Acquired Spot Images: 14

[Series 1: one shot · 0.14mm/px · 6 of 13 slices shown (1 of 2)]
[im 1/13]
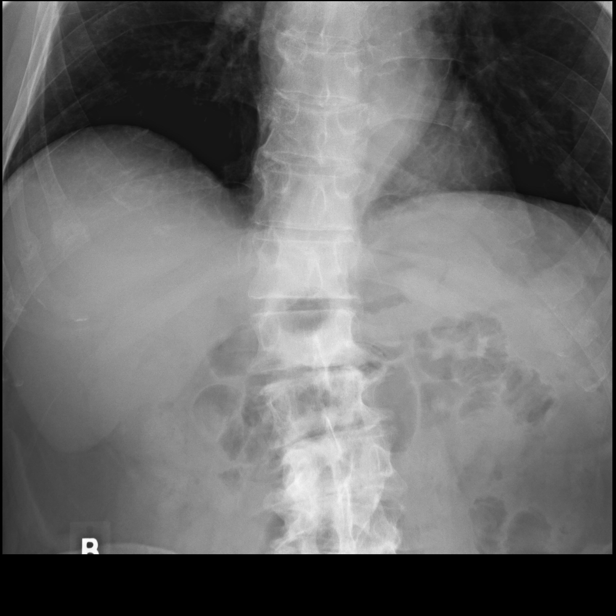
[im 4/13]
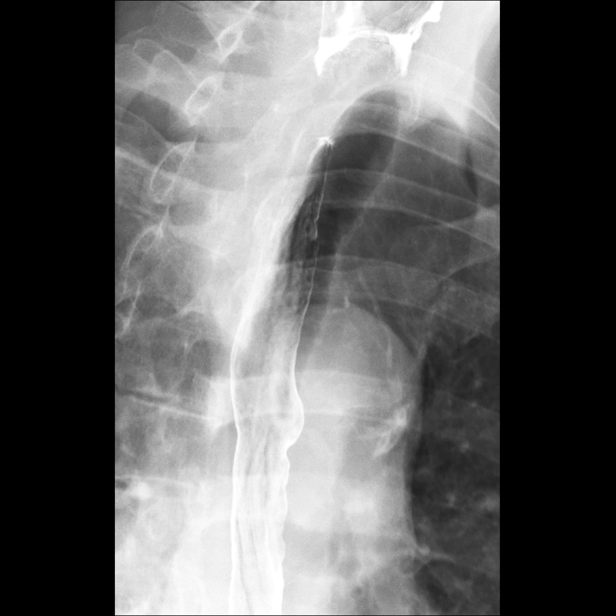
[im 6/13]
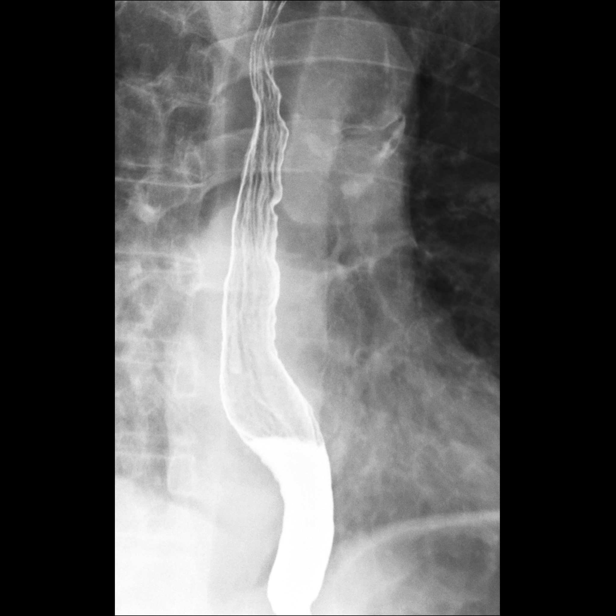
[im 9/13]
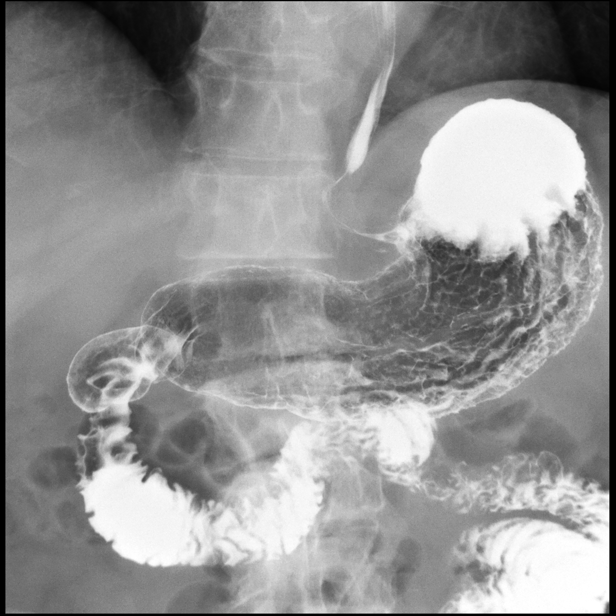
[im 10/13]
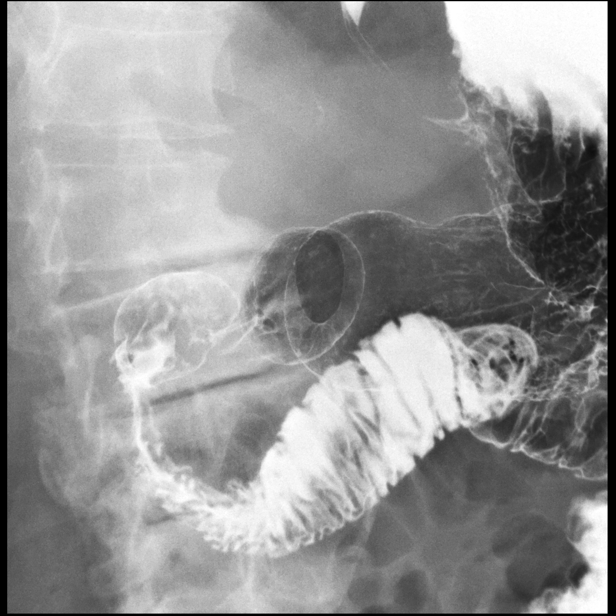
[im 12/13]
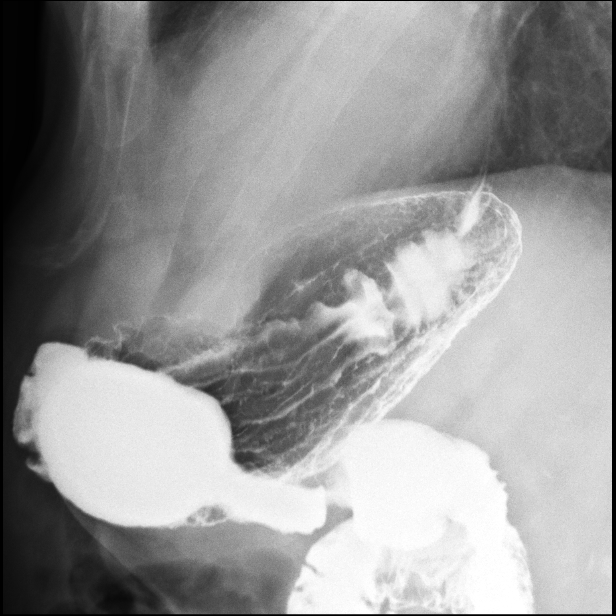

[Series 2: sequence · 2 of 40 frames shown (1 of 3)]
[frame 7/40]
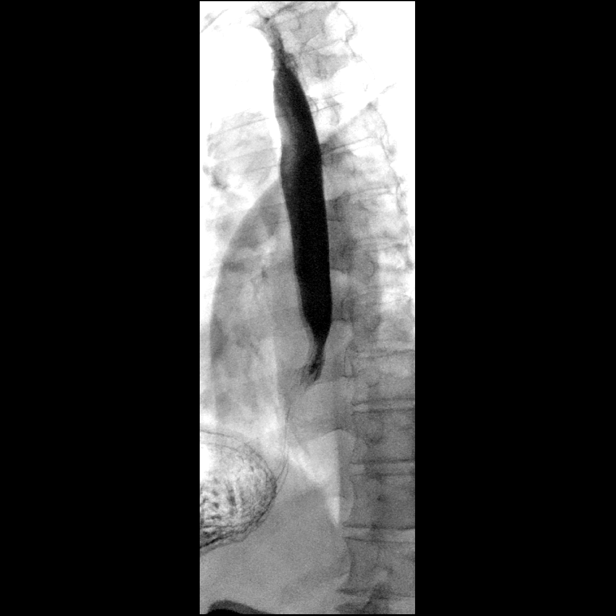
[frame 21/40]
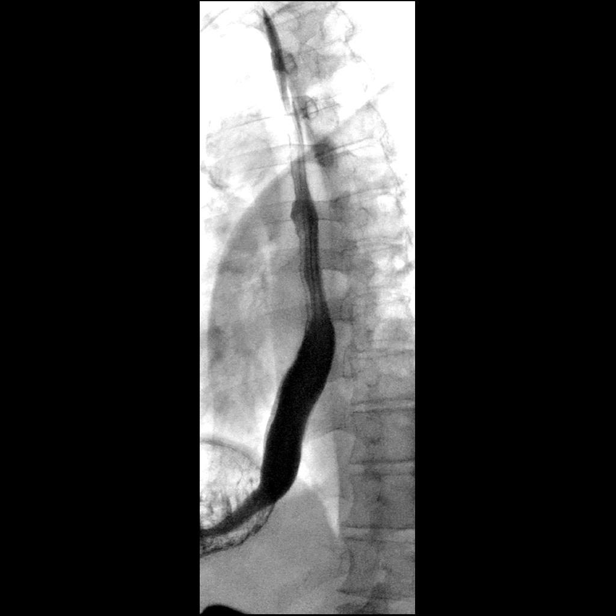

[Series 3: sequence · 2 of 71 frames shown (2 of 3)]
[frame 31/71]
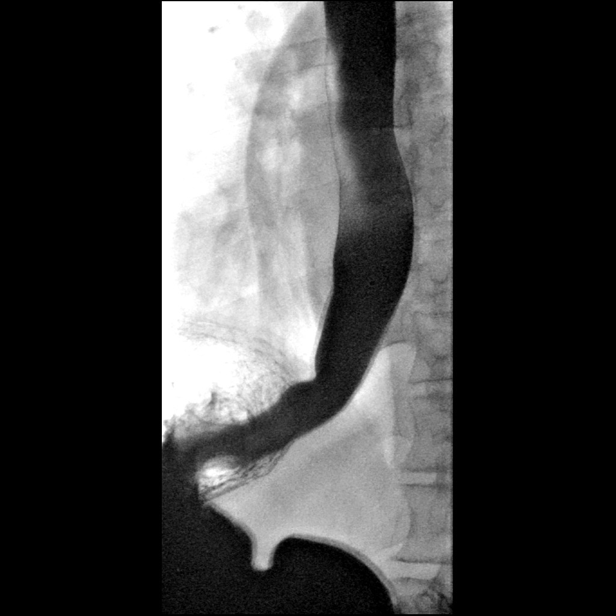
[frame 61/71]
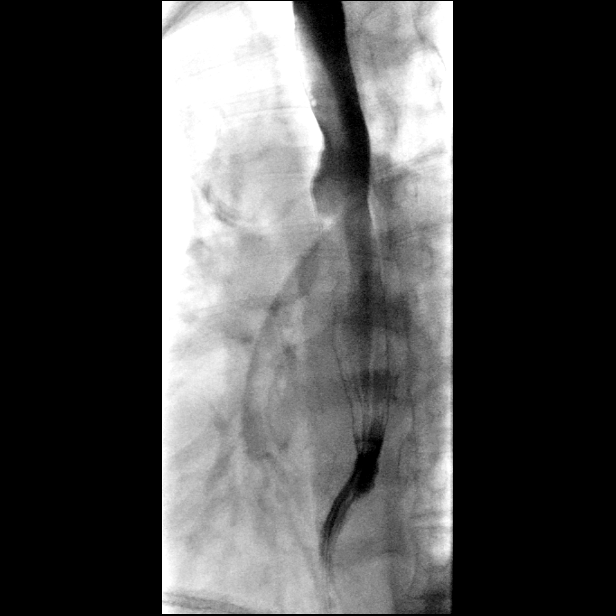

[Series 5: sequence · 3 of 38 frames shown (3 of 3)]
[frame 6/38]
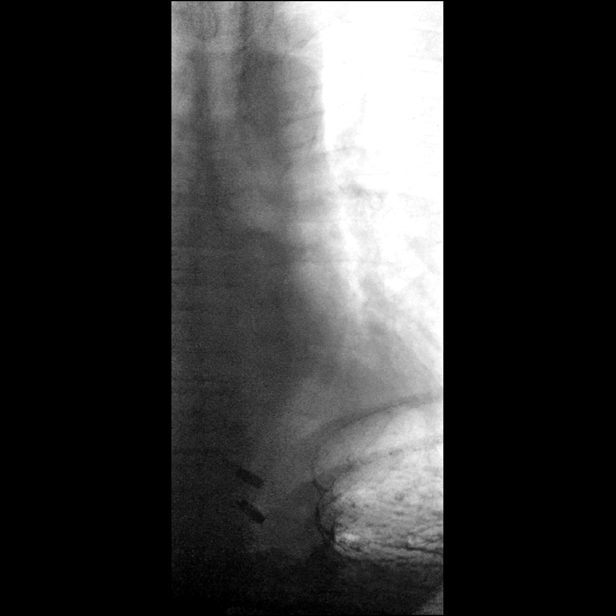
[frame 8/38]
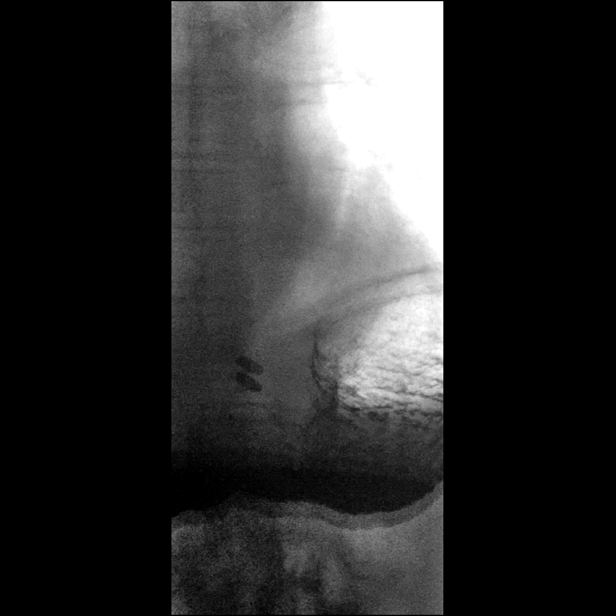
[frame 33/38]
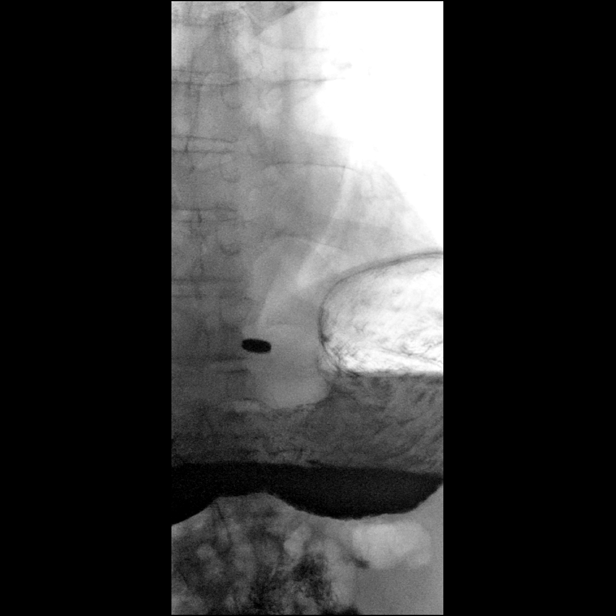

[Series 6: one shot · 1 of 3 slices shown (2 of 2)]
[im 3/3]
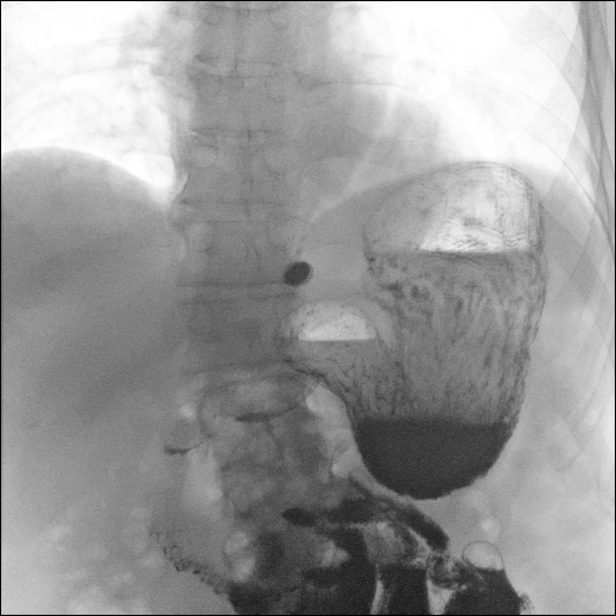

[14 of 24 positions shown; findings below may reference images not displayed]

FINDINGS: Scout radiograph demonstrates no dilated small bowel loops. No
evidence of pneumatosis or pneumoperitoneum. No radiopaque
nephrolithiasis. Marked lumbar spondylosis. Clear lung bases.

Normal esophageal motility. No hiatal hernia. No gastroesophageal
reflux elicited, despite provocative maneuvers including water
siphon test. Normal esophageal mucosa, with no evidence of reflux
esophagitis. Esophageal distensibility appeared normal during prone
swallows, however the 13 mm barium tablet became lodged at the
esophagogastric junction for several minutes despite multiple water
swallows. No evidence of a discrete esophageal mass or ulcer. The
esophagogastric junction is somewhat sharply angled.

Normal gastric emptying. There is mild diffuse lobulated gastric
fold thickening throughout the gastric body. No discrete gastric
filling defects, ulcers or erosions. Normal duodenal bulb and C
sweep, with no duodenal fold thickening, filling defects, strictures
or ulcers. Normal duodenal jejunal junction to the left of the
spine. Visualized proximal jejunal loops are normal caliber and
without fold thickening.
IMPRESSION: 1. No hiatal hernia.  No gastroesophageal reflux elicited.
2. Barium tablet became lodged at the esophagogastric junction for
several minutes. Esophagogastric junction is somewhat sharply
angled. Esophageal distensibility appears normal during prone
swallows, and there is no evidence of a discrete esophageal mass or
ulcer. A mild esophageal stricture is difficult to exclude at the
esophagogastric junction, although the pill stasis may simply be due
to the patient's anatomy. Upper endoscopic correlation may be
considered.
3. Mild diffuse lobulated gastric fold thickening in the gastric
body, nonspecific, most commonly due to H pylori gastritis, with
other etiologies not excluded. Upper endoscopic correlation
suggested.
4. Otherwise normal upper GI.

## 2021-03-21 DIAGNOSIS — R42 Dizziness and giddiness: Secondary | ICD-10-CM | POA: Diagnosis not present

## 2021-03-21 DIAGNOSIS — E538 Deficiency of other specified B group vitamins: Secondary | ICD-10-CM | POA: Diagnosis not present

## 2021-03-21 DIAGNOSIS — R1013 Epigastric pain: Secondary | ICD-10-CM | POA: Diagnosis not present

## 2021-03-27 DIAGNOSIS — G1221 Amyotrophic lateral sclerosis: Secondary | ICD-10-CM | POA: Diagnosis not present

## 2021-03-27 DIAGNOSIS — R488 Other symbolic dysfunctions: Secondary | ICD-10-CM | POA: Diagnosis not present

## 2021-03-28 DIAGNOSIS — R262 Difficulty in walking, not elsewhere classified: Secondary | ICD-10-CM | POA: Diagnosis not present

## 2021-03-28 DIAGNOSIS — R278 Other lack of coordination: Secondary | ICD-10-CM | POA: Diagnosis not present

## 2021-03-28 DIAGNOSIS — G1221 Amyotrophic lateral sclerosis: Secondary | ICD-10-CM | POA: Diagnosis not present

## 2021-03-29 DIAGNOSIS — G1221 Amyotrophic lateral sclerosis: Secondary | ICD-10-CM | POA: Diagnosis not present

## 2021-03-29 DIAGNOSIS — R488 Other symbolic dysfunctions: Secondary | ICD-10-CM | POA: Diagnosis not present

## 2021-04-01 DIAGNOSIS — R262 Difficulty in walking, not elsewhere classified: Secondary | ICD-10-CM | POA: Diagnosis not present

## 2021-04-01 DIAGNOSIS — R278 Other lack of coordination: Secondary | ICD-10-CM | POA: Diagnosis not present

## 2021-04-01 DIAGNOSIS — G1221 Amyotrophic lateral sclerosis: Secondary | ICD-10-CM | POA: Diagnosis not present

## 2021-04-02 DIAGNOSIS — R488 Other symbolic dysfunctions: Secondary | ICD-10-CM | POA: Diagnosis not present

## 2021-04-02 DIAGNOSIS — G1221 Amyotrophic lateral sclerosis: Secondary | ICD-10-CM | POA: Diagnosis not present

## 2021-04-03 DIAGNOSIS — G1221 Amyotrophic lateral sclerosis: Secondary | ICD-10-CM | POA: Diagnosis not present

## 2021-04-03 DIAGNOSIS — R488 Other symbolic dysfunctions: Secondary | ICD-10-CM | POA: Diagnosis not present

## 2021-04-04 DIAGNOSIS — R262 Difficulty in walking, not elsewhere classified: Secondary | ICD-10-CM | POA: Diagnosis not present

## 2021-04-04 DIAGNOSIS — R278 Other lack of coordination: Secondary | ICD-10-CM | POA: Diagnosis not present

## 2021-04-04 DIAGNOSIS — G1221 Amyotrophic lateral sclerosis: Secondary | ICD-10-CM | POA: Diagnosis not present

## 2021-04-05 DIAGNOSIS — R488 Other symbolic dysfunctions: Secondary | ICD-10-CM | POA: Diagnosis not present

## 2021-04-05 DIAGNOSIS — G1221 Amyotrophic lateral sclerosis: Secondary | ICD-10-CM | POA: Diagnosis not present

## 2021-04-08 DIAGNOSIS — G1221 Amyotrophic lateral sclerosis: Secondary | ICD-10-CM | POA: Diagnosis not present

## 2021-04-08 DIAGNOSIS — R488 Other symbolic dysfunctions: Secondary | ICD-10-CM | POA: Diagnosis not present

## 2021-04-09 DIAGNOSIS — G1221 Amyotrophic lateral sclerosis: Secondary | ICD-10-CM | POA: Diagnosis not present

## 2021-04-09 DIAGNOSIS — R278 Other lack of coordination: Secondary | ICD-10-CM | POA: Diagnosis not present

## 2021-04-09 DIAGNOSIS — R488 Other symbolic dysfunctions: Secondary | ICD-10-CM | POA: Diagnosis not present

## 2021-04-09 DIAGNOSIS — R262 Difficulty in walking, not elsewhere classified: Secondary | ICD-10-CM | POA: Diagnosis not present

## 2021-04-10 DIAGNOSIS — R488 Other symbolic dysfunctions: Secondary | ICD-10-CM | POA: Diagnosis not present

## 2021-04-10 DIAGNOSIS — G1221 Amyotrophic lateral sclerosis: Secondary | ICD-10-CM | POA: Diagnosis not present

## 2021-04-11 DIAGNOSIS — G1221 Amyotrophic lateral sclerosis: Secondary | ICD-10-CM | POA: Diagnosis not present

## 2021-04-11 DIAGNOSIS — R278 Other lack of coordination: Secondary | ICD-10-CM | POA: Diagnosis not present

## 2021-04-11 DIAGNOSIS — R262 Difficulty in walking, not elsewhere classified: Secondary | ICD-10-CM | POA: Diagnosis not present

## 2021-04-15 DIAGNOSIS — G1221 Amyotrophic lateral sclerosis: Secondary | ICD-10-CM | POA: Diagnosis not present

## 2021-04-15 DIAGNOSIS — R488 Other symbolic dysfunctions: Secondary | ICD-10-CM | POA: Diagnosis not present

## 2021-04-17 DIAGNOSIS — G1221 Amyotrophic lateral sclerosis: Secondary | ICD-10-CM | POA: Diagnosis not present

## 2021-04-17 DIAGNOSIS — R278 Other lack of coordination: Secondary | ICD-10-CM | POA: Diagnosis not present

## 2021-04-17 DIAGNOSIS — R488 Other symbolic dysfunctions: Secondary | ICD-10-CM | POA: Diagnosis not present

## 2021-04-17 DIAGNOSIS — R262 Difficulty in walking, not elsewhere classified: Secondary | ICD-10-CM | POA: Diagnosis not present

## 2021-04-18 DIAGNOSIS — R278 Other lack of coordination: Secondary | ICD-10-CM | POA: Diagnosis not present

## 2021-04-18 DIAGNOSIS — R488 Other symbolic dysfunctions: Secondary | ICD-10-CM | POA: Diagnosis not present

## 2021-04-18 DIAGNOSIS — G1221 Amyotrophic lateral sclerosis: Secondary | ICD-10-CM | POA: Diagnosis not present

## 2021-04-18 DIAGNOSIS — R262 Difficulty in walking, not elsewhere classified: Secondary | ICD-10-CM | POA: Diagnosis not present

## 2021-04-22 DIAGNOSIS — G1221 Amyotrophic lateral sclerosis: Secondary | ICD-10-CM | POA: Diagnosis not present

## 2021-04-22 DIAGNOSIS — R488 Other symbolic dysfunctions: Secondary | ICD-10-CM | POA: Diagnosis not present

## 2021-04-23 DIAGNOSIS — G1221 Amyotrophic lateral sclerosis: Secondary | ICD-10-CM | POA: Diagnosis not present

## 2021-04-23 DIAGNOSIS — R488 Other symbolic dysfunctions: Secondary | ICD-10-CM | POA: Diagnosis not present

## 2021-04-24 DIAGNOSIS — R488 Other symbolic dysfunctions: Secondary | ICD-10-CM | POA: Diagnosis not present

## 2021-04-24 DIAGNOSIS — G1221 Amyotrophic lateral sclerosis: Secondary | ICD-10-CM | POA: Diagnosis not present

## 2021-04-24 DIAGNOSIS — R262 Difficulty in walking, not elsewhere classified: Secondary | ICD-10-CM | POA: Diagnosis not present

## 2021-04-24 DIAGNOSIS — R278 Other lack of coordination: Secondary | ICD-10-CM | POA: Diagnosis not present

## 2021-04-26 DIAGNOSIS — R262 Difficulty in walking, not elsewhere classified: Secondary | ICD-10-CM | POA: Diagnosis not present

## 2021-04-26 DIAGNOSIS — G1221 Amyotrophic lateral sclerosis: Secondary | ICD-10-CM | POA: Diagnosis not present

## 2021-04-26 DIAGNOSIS — R278 Other lack of coordination: Secondary | ICD-10-CM | POA: Diagnosis not present

## 2021-05-08 DIAGNOSIS — G1221 Amyotrophic lateral sclerosis: Secondary | ICD-10-CM | POA: Diagnosis not present

## 2021-05-08 DIAGNOSIS — R488 Other symbolic dysfunctions: Secondary | ICD-10-CM | POA: Diagnosis not present

## 2021-05-13 DIAGNOSIS — G1221 Amyotrophic lateral sclerosis: Secondary | ICD-10-CM | POA: Diagnosis not present

## 2021-05-13 DIAGNOSIS — R488 Other symbolic dysfunctions: Secondary | ICD-10-CM | POA: Diagnosis not present

## 2021-05-21 DIAGNOSIS — R488 Other symbolic dysfunctions: Secondary | ICD-10-CM | POA: Diagnosis not present

## 2021-05-21 DIAGNOSIS — G1221 Amyotrophic lateral sclerosis: Secondary | ICD-10-CM | POA: Diagnosis not present

## 2021-06-20 DIAGNOSIS — R488 Other symbolic dysfunctions: Secondary | ICD-10-CM | POA: Diagnosis not present

## 2021-06-20 DIAGNOSIS — G1221 Amyotrophic lateral sclerosis: Secondary | ICD-10-CM | POA: Diagnosis not present

## 2021-07-19 DIAGNOSIS — R488 Other symbolic dysfunctions: Secondary | ICD-10-CM | POA: Diagnosis not present

## 2021-07-19 DIAGNOSIS — G1221 Amyotrophic lateral sclerosis: Secondary | ICD-10-CM | POA: Diagnosis not present

## 2021-08-16 DIAGNOSIS — R488 Other symbolic dysfunctions: Secondary | ICD-10-CM | POA: Diagnosis not present

## 2021-08-16 DIAGNOSIS — G1221 Amyotrophic lateral sclerosis: Secondary | ICD-10-CM | POA: Diagnosis not present

## 2021-10-30 DIAGNOSIS — G309 Alzheimer's disease, unspecified: Secondary | ICD-10-CM | POA: Diagnosis not present

## 2021-10-31 ENCOUNTER — Telehealth: Payer: Self-pay | Admitting: Neurology

## 2021-10-31 DIAGNOSIS — G1220 Motor neuron disease, unspecified: Secondary | ICD-10-CM

## 2021-10-31 NOTE — Telephone Encounter (Signed)
I spoke to the patient's dgt. She would like a referral put in for [facility]. He was seen by Dr. Sena Hitch ([facility]) but would rather take her father to [place].  Also, she would like a call from Dr. Terrace Arabia to discuss his past NCV/EMG results and talk more about the labs completed w/ Dr. Shane Crutch.  Dgt, Arline Asp [phone]

## 2021-10-31 NOTE — Telephone Encounter (Signed)
Pt's daughter is asking if another referral can be sent to the [facility] for pt, please call.  Pt was unable to go under to initial referral.

## 2021-11-01 NOTE — Telephone Encounter (Signed)
I left message for her, will call her again at [phone]

## 2021-11-07 NOTE — Telephone Encounter (Addendum)
I was able to talk with his daughter Arline Asp, patient continued to progress, has worsening upper extremity muscle atrophy, weakness, increased gait abnormality  I put in a referral for [facility],  If patient and his daughter desire, she will contact our office for revisit

## 2021-11-07 NOTE — Addendum Note (Signed)
Addended by: Levert Feinstein on: 11/07/2021 05:19 PM   Modules accepted: Orders

## 2021-11-11 NOTE — Telephone Encounter (Addendum)
Referral has been sent to [facility]. Phone: [phone].

## 2022-08-13 DIAGNOSIS — R488 Other symbolic dysfunctions: Secondary | ICD-10-CM | POA: Diagnosis not present

## 2022-08-15 DIAGNOSIS — R488 Other symbolic dysfunctions: Secondary | ICD-10-CM | POA: Diagnosis not present

## 2022-08-19 DIAGNOSIS — R488 Other symbolic dysfunctions: Secondary | ICD-10-CM | POA: Diagnosis not present

## 2022-08-22 DIAGNOSIS — R488 Other symbolic dysfunctions: Secondary | ICD-10-CM | POA: Diagnosis not present

## 2022-08-25 DIAGNOSIS — R488 Other symbolic dysfunctions: Secondary | ICD-10-CM | POA: Diagnosis not present

## 2022-08-26 DIAGNOSIS — R488 Other symbolic dysfunctions: Secondary | ICD-10-CM | POA: Diagnosis not present

## 2022-09-02 DIAGNOSIS — R488 Other symbolic dysfunctions: Secondary | ICD-10-CM | POA: Diagnosis not present

## 2022-09-04 DIAGNOSIS — R488 Other symbolic dysfunctions: Secondary | ICD-10-CM | POA: Diagnosis not present

## 2022-09-08 DIAGNOSIS — R488 Other symbolic dysfunctions: Secondary | ICD-10-CM | POA: Diagnosis not present

## 2022-09-10 DIAGNOSIS — R488 Other symbolic dysfunctions: Secondary | ICD-10-CM | POA: Diagnosis not present

## 2022-09-16 DIAGNOSIS — R488 Other symbolic dysfunctions: Secondary | ICD-10-CM | POA: Diagnosis not present

## 2022-09-18 DIAGNOSIS — R488 Other symbolic dysfunctions: Secondary | ICD-10-CM | POA: Diagnosis not present

## 2022-09-23 ENCOUNTER — Ambulatory Visit: Payer: Medicare Other | Admitting: Neurology

## 2022-09-23 DIAGNOSIS — R488 Other symbolic dysfunctions: Secondary | ICD-10-CM | POA: Diagnosis not present

## 2022-09-26 DIAGNOSIS — R488 Other symbolic dysfunctions: Secondary | ICD-10-CM | POA: Diagnosis not present

## 2022-09-30 DIAGNOSIS — R488 Other symbolic dysfunctions: Secondary | ICD-10-CM | POA: Diagnosis not present

## 2022-10-29 DIAGNOSIS — R488 Other symbolic dysfunctions: Secondary | ICD-10-CM | POA: Diagnosis not present

## 2022-11-28 DIAGNOSIS — R488 Other symbolic dysfunctions: Secondary | ICD-10-CM | POA: Diagnosis not present

## 2022-12-26 DIAGNOSIS — R488 Other symbolic dysfunctions: Secondary | ICD-10-CM | POA: Diagnosis not present

## 2023-02-21 DIAGNOSIS — R42 Dizziness and giddiness: Secondary | ICD-10-CM | POA: Diagnosis not present

## 2023-02-21 DIAGNOSIS — R410 Disorientation, unspecified: Secondary | ICD-10-CM | POA: Diagnosis not present

## 2023-02-21 DIAGNOSIS — Z87891 Personal history of nicotine dependence: Secondary | ICD-10-CM | POA: Diagnosis not present

## 2023-02-21 DIAGNOSIS — R002 Palpitations: Secondary | ICD-10-CM | POA: Diagnosis not present

## 2023-02-21 DIAGNOSIS — R0789 Other chest pain: Secondary | ICD-10-CM | POA: Diagnosis not present

## 2023-02-21 DIAGNOSIS — R079 Chest pain, unspecified: Secondary | ICD-10-CM | POA: Diagnosis not present

## 2023-05-14 DIAGNOSIS — M25512 Pain in left shoulder: Secondary | ICD-10-CM | POA: Diagnosis not present

## 2023-05-14 DIAGNOSIS — M25511 Pain in right shoulder: Secondary | ICD-10-CM | POA: Diagnosis not present

## 2023-08-19 DIAGNOSIS — H9319 Tinnitus, unspecified ear: Secondary | ICD-10-CM | POA: Diagnosis not present

## 2023-08-19 DIAGNOSIS — K219 Gastro-esophageal reflux disease without esophagitis: Secondary | ICD-10-CM | POA: Diagnosis not present

## 2023-09-16 DIAGNOSIS — B309 Viral conjunctivitis, unspecified: Secondary | ICD-10-CM | POA: Diagnosis not present

## 2023-09-16 DIAGNOSIS — K219 Gastro-esophageal reflux disease without esophagitis: Secondary | ICD-10-CM | POA: Diagnosis not present

## 2023-09-22 DIAGNOSIS — K219 Gastro-esophageal reflux disease without esophagitis: Secondary | ICD-10-CM | POA: Diagnosis not present

## 2023-10-14 DIAGNOSIS — K219 Gastro-esophageal reflux disease without esophagitis: Secondary | ICD-10-CM | POA: Diagnosis not present

## 2023-11-11 DIAGNOSIS — D519 Vitamin B12 deficiency anemia, unspecified: Secondary | ICD-10-CM | POA: Diagnosis not present

## 2023-11-23 DIAGNOSIS — K219 Gastro-esophageal reflux disease without esophagitis: Secondary | ICD-10-CM | POA: Diagnosis not present

## 2023-11-23 DIAGNOSIS — H9319 Tinnitus, unspecified ear: Secondary | ICD-10-CM | POA: Diagnosis not present

## 2023-12-09 DIAGNOSIS — K219 Gastro-esophageal reflux disease without esophagitis: Secondary | ICD-10-CM | POA: Diagnosis not present

## 2024-01-06 DIAGNOSIS — K219 Gastro-esophageal reflux disease without esophagitis: Secondary | ICD-10-CM | POA: Diagnosis not present

## 2024-01-06 DIAGNOSIS — E538 Deficiency of other specified B group vitamins: Secondary | ICD-10-CM | POA: Diagnosis not present

## 2024-01-12 DIAGNOSIS — D519 Vitamin B12 deficiency anemia, unspecified: Secondary | ICD-10-CM | POA: Diagnosis not present

## 2024-01-15 DIAGNOSIS — S22050A Wedge compression fracture of T5-T6 vertebra, initial encounter for closed fracture: Secondary | ICD-10-CM | POA: Diagnosis not present

## 2024-01-15 DIAGNOSIS — S2232XA Fracture of one rib, left side, initial encounter for closed fracture: Secondary | ICD-10-CM | POA: Diagnosis not present

## 2024-01-15 DIAGNOSIS — G1221 Amyotrophic lateral sclerosis: Secondary | ICD-10-CM | POA: Diagnosis not present

## 2024-01-15 DIAGNOSIS — G309 Alzheimer's disease, unspecified: Secondary | ICD-10-CM | POA: Diagnosis not present

## 2024-01-15 DIAGNOSIS — Z66 Do not resuscitate: Secondary | ICD-10-CM | POA: Diagnosis not present

## 2024-01-15 DIAGNOSIS — R1310 Dysphagia, unspecified: Secondary | ICD-10-CM | POA: Diagnosis not present

## 2024-01-15 DIAGNOSIS — I10 Essential (primary) hypertension: Secondary | ICD-10-CM | POA: Diagnosis not present

## 2024-01-15 DIAGNOSIS — R410 Disorientation, unspecified: Secondary | ICD-10-CM | POA: Diagnosis not present

## 2024-01-15 DIAGNOSIS — S3991XA Unspecified injury of abdomen, initial encounter: Secondary | ICD-10-CM | POA: Diagnosis not present

## 2024-01-15 DIAGNOSIS — T07XXXA Unspecified multiple injuries, initial encounter: Secondary | ICD-10-CM | POA: Diagnosis not present

## 2024-01-15 DIAGNOSIS — D72829 Elevated white blood cell count, unspecified: Secondary | ICD-10-CM | POA: Diagnosis not present

## 2024-01-15 DIAGNOSIS — R339 Retention of urine, unspecified: Secondary | ICD-10-CM | POA: Diagnosis not present

## 2024-01-15 DIAGNOSIS — S0083XA Contusion of other part of head, initial encounter: Secondary | ICD-10-CM | POA: Diagnosis not present

## 2024-01-15 DIAGNOSIS — Z87891 Personal history of nicotine dependence: Secondary | ICD-10-CM | POA: Diagnosis not present

## 2024-01-15 DIAGNOSIS — S199XXA Unspecified injury of neck, initial encounter: Secondary | ICD-10-CM | POA: Diagnosis not present

## 2024-01-15 DIAGNOSIS — Z9189 Other specified personal risk factors, not elsewhere classified: Secondary | ICD-10-CM | POA: Diagnosis not present

## 2024-01-15 DIAGNOSIS — G8911 Acute pain due to trauma: Secondary | ICD-10-CM | POA: Diagnosis not present

## 2024-01-15 DIAGNOSIS — S2242XA Multiple fractures of ribs, left side, initial encounter for closed fracture: Secondary | ICD-10-CM | POA: Diagnosis not present

## 2024-01-15 DIAGNOSIS — I493 Ventricular premature depolarization: Secondary | ICD-10-CM | POA: Diagnosis not present

## 2024-01-15 DIAGNOSIS — S0990XA Unspecified injury of head, initial encounter: Secondary | ICD-10-CM | POA: Diagnosis not present

## 2024-01-15 DIAGNOSIS — J984 Other disorders of lung: Secondary | ICD-10-CM | POA: Diagnosis not present

## 2024-01-15 DIAGNOSIS — N390 Urinary tract infection, site not specified: Secondary | ICD-10-CM | POA: Diagnosis not present

## 2024-01-15 DIAGNOSIS — I471 Supraventricular tachycardia, unspecified: Secondary | ICD-10-CM | POA: Diagnosis not present

## 2024-01-15 DIAGNOSIS — S2242XD Multiple fractures of ribs, left side, subsequent encounter for fracture with routine healing: Secondary | ICD-10-CM | POA: Diagnosis not present

## 2024-01-15 DIAGNOSIS — Z781 Physical restraint status: Secondary | ICD-10-CM | POA: Diagnosis not present

## 2024-01-15 DIAGNOSIS — S0993XA Unspecified injury of face, initial encounter: Secondary | ICD-10-CM | POA: Diagnosis not present

## 2024-01-15 DIAGNOSIS — R1312 Dysphagia, oropharyngeal phase: Secondary | ICD-10-CM | POA: Diagnosis not present

## 2024-01-15 DIAGNOSIS — T1490XA Injury, unspecified, initial encounter: Secondary | ICD-10-CM | POA: Diagnosis not present

## 2024-01-15 DIAGNOSIS — S3992XA Unspecified injury of lower back, initial encounter: Secondary | ICD-10-CM | POA: Diagnosis not present

## 2024-01-15 DIAGNOSIS — S22060A Wedge compression fracture of T7-T8 vertebra, initial encounter for closed fracture: Secondary | ICD-10-CM | POA: Diagnosis not present

## 2024-01-15 DIAGNOSIS — I352 Nonrheumatic aortic (valve) stenosis with insufficiency: Secondary | ICD-10-CM | POA: Diagnosis not present

## 2024-01-15 DIAGNOSIS — Z7409 Other reduced mobility: Secondary | ICD-10-CM | POA: Diagnosis not present

## 2024-01-15 DIAGNOSIS — D72828 Other elevated white blood cell count: Secondary | ICD-10-CM | POA: Diagnosis not present

## 2024-01-15 DIAGNOSIS — Z79899 Other long term (current) drug therapy: Secondary | ICD-10-CM | POA: Diagnosis not present

## 2024-01-15 DIAGNOSIS — R079 Chest pain, unspecified: Secondary | ICD-10-CM | POA: Diagnosis not present

## 2024-01-15 DIAGNOSIS — E785 Hyperlipidemia, unspecified: Secondary | ICD-10-CM | POA: Diagnosis not present

## 2024-01-15 DIAGNOSIS — S22000A Wedge compression fracture of unspecified thoracic vertebra, initial encounter for closed fracture: Secondary | ICD-10-CM | POA: Diagnosis not present

## 2024-01-15 DIAGNOSIS — R52 Pain, unspecified: Secondary | ICD-10-CM | POA: Diagnosis not present

## 2024-01-15 DIAGNOSIS — R000 Tachycardia, unspecified: Secondary | ICD-10-CM | POA: Diagnosis not present

## 2024-01-15 DIAGNOSIS — Z0189 Encounter for other specified special examinations: Secondary | ICD-10-CM | POA: Diagnosis not present

## 2024-01-15 DIAGNOSIS — S2243XA Multiple fractures of ribs, bilateral, initial encounter for closed fracture: Secondary | ICD-10-CM | POA: Diagnosis not present

## 2024-01-15 DIAGNOSIS — R54 Age-related physical debility: Secondary | ICD-10-CM | POA: Diagnosis not present

## 2024-01-15 DIAGNOSIS — R296 Repeated falls: Secondary | ICD-10-CM | POA: Diagnosis not present

## 2024-01-15 DIAGNOSIS — W1830XA Fall on same level, unspecified, initial encounter: Secondary | ICD-10-CM | POA: Diagnosis not present

## 2024-01-15 DIAGNOSIS — G308 Other Alzheimer's disease: Secondary | ICD-10-CM | POA: Diagnosis not present

## 2024-01-15 DIAGNOSIS — Z789 Other specified health status: Secondary | ICD-10-CM | POA: Diagnosis not present

## 2024-01-15 DIAGNOSIS — W19XXXD Unspecified fall, subsequent encounter: Secondary | ICD-10-CM | POA: Diagnosis not present

## 2024-01-16 DIAGNOSIS — I352 Nonrheumatic aortic (valve) stenosis with insufficiency: Secondary | ICD-10-CM | POA: Diagnosis not present

## 2024-01-17 DIAGNOSIS — S22060A Wedge compression fracture of T7-T8 vertebra, initial encounter for closed fracture: Secondary | ICD-10-CM | POA: Diagnosis not present

## 2024-01-17 DIAGNOSIS — S22050A Wedge compression fracture of T5-T6 vertebra, initial encounter for closed fracture: Secondary | ICD-10-CM | POA: Diagnosis not present

## 2024-01-18 DIAGNOSIS — D72828 Other elevated white blood cell count: Secondary | ICD-10-CM | POA: Diagnosis not present

## 2024-01-18 DIAGNOSIS — S2242XA Multiple fractures of ribs, left side, initial encounter for closed fracture: Secondary | ICD-10-CM | POA: Diagnosis not present

## 2024-01-18 DIAGNOSIS — G8911 Acute pain due to trauma: Secondary | ICD-10-CM | POA: Diagnosis not present

## 2024-01-18 DIAGNOSIS — Z79899 Other long term (current) drug therapy: Secondary | ICD-10-CM | POA: Diagnosis not present

## 2024-01-18 DIAGNOSIS — W1830XA Fall on same level, unspecified, initial encounter: Secondary | ICD-10-CM | POA: Diagnosis not present

## 2024-01-18 DIAGNOSIS — Z789 Other specified health status: Secondary | ICD-10-CM | POA: Diagnosis not present

## 2024-01-18 DIAGNOSIS — Z0189 Encounter for other specified special examinations: Secondary | ICD-10-CM | POA: Diagnosis not present

## 2024-01-18 DIAGNOSIS — R339 Retention of urine, unspecified: Secondary | ICD-10-CM | POA: Diagnosis not present

## 2024-01-18 DIAGNOSIS — Z66 Do not resuscitate: Secondary | ICD-10-CM | POA: Diagnosis not present

## 2024-01-18 DIAGNOSIS — R296 Repeated falls: Secondary | ICD-10-CM | POA: Diagnosis not present

## 2024-01-18 DIAGNOSIS — I493 Ventricular premature depolarization: Secondary | ICD-10-CM | POA: Diagnosis not present

## 2024-01-18 DIAGNOSIS — R1312 Dysphagia, oropharyngeal phase: Secondary | ICD-10-CM | POA: Diagnosis not present

## 2024-01-18 DIAGNOSIS — Z7409 Other reduced mobility: Secondary | ICD-10-CM | POA: Diagnosis not present

## 2024-01-18 DIAGNOSIS — R54 Age-related physical debility: Secondary | ICD-10-CM | POA: Diagnosis not present

## 2024-01-19 DIAGNOSIS — Z79899 Other long term (current) drug therapy: Secondary | ICD-10-CM | POA: Diagnosis not present

## 2024-01-19 DIAGNOSIS — Z789 Other specified health status: Secondary | ICD-10-CM | POA: Diagnosis not present

## 2024-01-19 DIAGNOSIS — R54 Age-related physical debility: Secondary | ICD-10-CM | POA: Diagnosis not present

## 2024-01-19 DIAGNOSIS — G308 Other Alzheimer's disease: Secondary | ICD-10-CM | POA: Diagnosis not present

## 2024-01-19 DIAGNOSIS — R339 Retention of urine, unspecified: Secondary | ICD-10-CM | POA: Diagnosis not present

## 2024-01-19 DIAGNOSIS — Z7409 Other reduced mobility: Secondary | ICD-10-CM | POA: Diagnosis not present

## 2024-01-19 DIAGNOSIS — G8911 Acute pain due to trauma: Secondary | ICD-10-CM | POA: Diagnosis not present

## 2024-01-19 DIAGNOSIS — W1830XA Fall on same level, unspecified, initial encounter: Secondary | ICD-10-CM | POA: Diagnosis not present

## 2024-01-20 DIAGNOSIS — R52 Pain, unspecified: Secondary | ICD-10-CM | POA: Diagnosis not present

## 2024-01-20 DIAGNOSIS — Z9189 Other specified personal risk factors, not elsewhere classified: Secondary | ICD-10-CM | POA: Diagnosis not present

## 2024-01-20 DIAGNOSIS — W1830XA Fall on same level, unspecified, initial encounter: Secondary | ICD-10-CM | POA: Diagnosis not present

## 2024-01-20 DIAGNOSIS — D72828 Other elevated white blood cell count: Secondary | ICD-10-CM | POA: Diagnosis not present

## 2024-01-20 DIAGNOSIS — S2242XA Multiple fractures of ribs, left side, initial encounter for closed fracture: Secondary | ICD-10-CM | POA: Diagnosis not present

## 2024-01-20 DIAGNOSIS — R1312 Dysphagia, oropharyngeal phase: Secondary | ICD-10-CM | POA: Diagnosis not present

## 2024-01-20 DIAGNOSIS — R339 Retention of urine, unspecified: Secondary | ICD-10-CM | POA: Diagnosis not present

## 2024-01-20 DIAGNOSIS — R410 Disorientation, unspecified: Secondary | ICD-10-CM | POA: Diagnosis not present

## 2024-01-20 DIAGNOSIS — Z789 Other specified health status: Secondary | ICD-10-CM | POA: Diagnosis not present

## 2024-01-20 DIAGNOSIS — Z7409 Other reduced mobility: Secondary | ICD-10-CM | POA: Diagnosis not present

## 2024-01-21 DIAGNOSIS — S22060A Wedge compression fracture of T7-T8 vertebra, initial encounter for closed fracture: Secondary | ICD-10-CM | POA: Diagnosis not present

## 2024-01-21 DIAGNOSIS — S22050A Wedge compression fracture of T5-T6 vertebra, initial encounter for closed fracture: Secondary | ICD-10-CM | POA: Diagnosis not present

## 2024-01-21 DIAGNOSIS — Z7409 Other reduced mobility: Secondary | ICD-10-CM | POA: Diagnosis not present

## 2024-01-21 DIAGNOSIS — R52 Pain, unspecified: Secondary | ICD-10-CM | POA: Diagnosis not present

## 2024-01-21 DIAGNOSIS — W1830XA Fall on same level, unspecified, initial encounter: Secondary | ICD-10-CM | POA: Diagnosis not present

## 2024-01-21 DIAGNOSIS — Z789 Other specified health status: Secondary | ICD-10-CM | POA: Diagnosis not present

## 2024-01-21 DIAGNOSIS — S2243XA Multiple fractures of ribs, bilateral, initial encounter for closed fracture: Secondary | ICD-10-CM | POA: Diagnosis not present

## 2024-01-21 DIAGNOSIS — Z9189 Other specified personal risk factors, not elsewhere classified: Secondary | ICD-10-CM | POA: Diagnosis not present

## 2024-01-22 DIAGNOSIS — W1830XA Fall on same level, unspecified, initial encounter: Secondary | ICD-10-CM | POA: Diagnosis not present

## 2024-01-22 DIAGNOSIS — G8911 Acute pain due to trauma: Secondary | ICD-10-CM | POA: Diagnosis not present

## 2024-01-22 DIAGNOSIS — Z7409 Other reduced mobility: Secondary | ICD-10-CM | POA: Diagnosis not present

## 2024-01-22 DIAGNOSIS — R54 Age-related physical debility: Secondary | ICD-10-CM | POA: Diagnosis not present

## 2024-01-22 DIAGNOSIS — T1490XA Injury, unspecified, initial encounter: Secondary | ICD-10-CM | POA: Diagnosis not present

## 2024-01-22 DIAGNOSIS — Z789 Other specified health status: Secondary | ICD-10-CM | POA: Diagnosis not present

## 2024-01-22 DIAGNOSIS — G308 Other Alzheimer's disease: Secondary | ICD-10-CM | POA: Diagnosis not present

## 2024-01-22 DIAGNOSIS — R339 Retention of urine, unspecified: Secondary | ICD-10-CM | POA: Diagnosis not present

## 2024-01-22 DIAGNOSIS — Z79899 Other long term (current) drug therapy: Secondary | ICD-10-CM | POA: Diagnosis not present

## 2024-01-23 DIAGNOSIS — N390 Urinary tract infection, site not specified: Secondary | ICD-10-CM | POA: Diagnosis not present

## 2024-01-23 DIAGNOSIS — W1830XA Fall on same level, unspecified, initial encounter: Secondary | ICD-10-CM | POA: Diagnosis not present

## 2024-01-23 DIAGNOSIS — R339 Retention of urine, unspecified: Secondary | ICD-10-CM | POA: Diagnosis not present

## 2024-01-24 DIAGNOSIS — N390 Urinary tract infection, site not specified: Secondary | ICD-10-CM | POA: Diagnosis not present

## 2024-01-24 DIAGNOSIS — W1830XA Fall on same level, unspecified, initial encounter: Secondary | ICD-10-CM | POA: Diagnosis not present

## 2024-01-24 DIAGNOSIS — R339 Retention of urine, unspecified: Secondary | ICD-10-CM | POA: Diagnosis not present

## 2024-01-25 DIAGNOSIS — Z7409 Other reduced mobility: Secondary | ICD-10-CM | POA: Diagnosis not present

## 2024-01-25 DIAGNOSIS — R52 Pain, unspecified: Secondary | ICD-10-CM | POA: Diagnosis not present

## 2024-01-25 DIAGNOSIS — Z789 Other specified health status: Secondary | ICD-10-CM | POA: Diagnosis not present

## 2024-01-25 DIAGNOSIS — G8911 Acute pain due to trauma: Secondary | ICD-10-CM | POA: Diagnosis not present

## 2024-01-25 DIAGNOSIS — Z9189 Other specified personal risk factors, not elsewhere classified: Secondary | ICD-10-CM | POA: Diagnosis not present

## 2024-01-26 DIAGNOSIS — R1310 Dysphagia, unspecified: Secondary | ICD-10-CM | POA: Diagnosis not present

## 2024-01-26 DIAGNOSIS — S2243XA Multiple fractures of ribs, bilateral, initial encounter for closed fracture: Secondary | ICD-10-CM | POA: Diagnosis not present

## 2024-01-26 DIAGNOSIS — G8911 Acute pain due to trauma: Secondary | ICD-10-CM | POA: Diagnosis not present

## 2024-01-27 DIAGNOSIS — G8911 Acute pain due to trauma: Secondary | ICD-10-CM | POA: Diagnosis not present

## 2024-01-27 DIAGNOSIS — S2243XA Multiple fractures of ribs, bilateral, initial encounter for closed fracture: Secondary | ICD-10-CM | POA: Diagnosis not present

## 2024-01-28 DIAGNOSIS — W19XXXD Unspecified fall, subsequent encounter: Secondary | ICD-10-CM | POA: Diagnosis not present

## 2024-01-28 DIAGNOSIS — S2242XD Multiple fractures of ribs, left side, subsequent encounter for fracture with routine healing: Secondary | ICD-10-CM | POA: Diagnosis not present

## 2024-01-28 DIAGNOSIS — R339 Retention of urine, unspecified: Secondary | ICD-10-CM | POA: Diagnosis not present

## 2024-01-28 DIAGNOSIS — W1830XA Fall on same level, unspecified, initial encounter: Secondary | ICD-10-CM | POA: Diagnosis not present

## 2024-02-08 DIAGNOSIS — R339 Retention of urine, unspecified: Secondary | ICD-10-CM | POA: Diagnosis not present

## 2024-02-08 DIAGNOSIS — M62838 Other muscle spasm: Secondary | ICD-10-CM | POA: Diagnosis not present

## 2024-02-08 DIAGNOSIS — R52 Pain, unspecified: Secondary | ICD-10-CM | POA: Diagnosis not present

## 2024-02-08 DIAGNOSIS — S329XXA Fracture of unspecified parts of lumbosacral spine and pelvis, initial encounter for closed fracture: Secondary | ICD-10-CM | POA: Diagnosis not present

## 2024-02-10 DIAGNOSIS — M6281 Muscle weakness (generalized): Secondary | ICD-10-CM | POA: Diagnosis not present

## 2024-02-10 DIAGNOSIS — R2681 Unsteadiness on feet: Secondary | ICD-10-CM | POA: Diagnosis not present

## 2024-02-11 DIAGNOSIS — M6281 Muscle weakness (generalized): Secondary | ICD-10-CM | POA: Diagnosis not present

## 2024-02-11 DIAGNOSIS — R2681 Unsteadiness on feet: Secondary | ICD-10-CM | POA: Diagnosis not present

## 2024-02-12 DIAGNOSIS — M6281 Muscle weakness (generalized): Secondary | ICD-10-CM | POA: Diagnosis not present

## 2024-02-12 DIAGNOSIS — R2681 Unsteadiness on feet: Secondary | ICD-10-CM | POA: Diagnosis not present

## 2024-02-15 DIAGNOSIS — R2681 Unsteadiness on feet: Secondary | ICD-10-CM | POA: Diagnosis not present

## 2024-02-15 DIAGNOSIS — M6281 Muscle weakness (generalized): Secondary | ICD-10-CM | POA: Diagnosis not present

## 2024-02-17 DIAGNOSIS — K219 Gastro-esophageal reflux disease without esophagitis: Secondary | ICD-10-CM | POA: Diagnosis not present

## 2024-02-17 DIAGNOSIS — S2249XA Multiple fractures of ribs, unspecified side, initial encounter for closed fracture: Secondary | ICD-10-CM | POA: Diagnosis not present

## 2024-02-17 DIAGNOSIS — M6281 Muscle weakness (generalized): Secondary | ICD-10-CM | POA: Diagnosis not present

## 2024-02-17 DIAGNOSIS — S22000A Wedge compression fracture of unspecified thoracic vertebra, initial encounter for closed fracture: Secondary | ICD-10-CM | POA: Diagnosis not present

## 2024-02-17 DIAGNOSIS — R2681 Unsteadiness on feet: Secondary | ICD-10-CM | POA: Diagnosis not present

## 2024-02-22 DIAGNOSIS — R059 Cough, unspecified: Secondary | ICD-10-CM | POA: Diagnosis not present

## 2024-02-23 DIAGNOSIS — R2681 Unsteadiness on feet: Secondary | ICD-10-CM | POA: Diagnosis not present

## 2024-02-23 DIAGNOSIS — M6281 Muscle weakness (generalized): Secondary | ICD-10-CM | POA: Diagnosis not present

## 2024-02-24 DIAGNOSIS — M6281 Muscle weakness (generalized): Secondary | ICD-10-CM | POA: Diagnosis not present

## 2024-02-24 DIAGNOSIS — R2681 Unsteadiness on feet: Secondary | ICD-10-CM | POA: Diagnosis not present

## 2024-02-25 DIAGNOSIS — R488 Other symbolic dysfunctions: Secondary | ICD-10-CM | POA: Diagnosis not present

## 2024-02-26 DIAGNOSIS — R488 Other symbolic dysfunctions: Secondary | ICD-10-CM | POA: Diagnosis not present

## 2024-02-29 DIAGNOSIS — R2681 Unsteadiness on feet: Secondary | ICD-10-CM | POA: Diagnosis not present

## 2024-02-29 DIAGNOSIS — M6281 Muscle weakness (generalized): Secondary | ICD-10-CM | POA: Diagnosis not present

## 2024-03-01 DIAGNOSIS — R488 Other symbolic dysfunctions: Secondary | ICD-10-CM | POA: Diagnosis not present

## 2024-03-02 DIAGNOSIS — R2681 Unsteadiness on feet: Secondary | ICD-10-CM | POA: Diagnosis not present

## 2024-03-02 DIAGNOSIS — M6281 Muscle weakness (generalized): Secondary | ICD-10-CM | POA: Diagnosis not present

## 2024-03-03 DIAGNOSIS — R488 Other symbolic dysfunctions: Secondary | ICD-10-CM | POA: Diagnosis not present

## 2024-03-07 DIAGNOSIS — M546 Pain in thoracic spine: Secondary | ICD-10-CM | POA: Diagnosis not present

## 2024-03-07 DIAGNOSIS — R488 Other symbolic dysfunctions: Secondary | ICD-10-CM | POA: Diagnosis not present

## 2024-03-07 DIAGNOSIS — S2249XA Multiple fractures of ribs, unspecified side, initial encounter for closed fracture: Secondary | ICD-10-CM | POA: Diagnosis not present

## 2024-03-08 DIAGNOSIS — R29898 Other symptoms and signs involving the musculoskeletal system: Secondary | ICD-10-CM | POA: Diagnosis not present

## 2024-03-08 DIAGNOSIS — R293 Abnormal posture: Secondary | ICD-10-CM | POA: Diagnosis not present

## 2024-03-08 DIAGNOSIS — Z9181 History of falling: Secondary | ICD-10-CM | POA: Diagnosis not present

## 2024-03-08 DIAGNOSIS — M6281 Muscle weakness (generalized): Secondary | ICD-10-CM | POA: Diagnosis not present

## 2024-03-09 DIAGNOSIS — R2681 Unsteadiness on feet: Secondary | ICD-10-CM | POA: Diagnosis not present

## 2024-03-09 DIAGNOSIS — M6281 Muscle weakness (generalized): Secondary | ICD-10-CM | POA: Diagnosis not present

## 2024-03-09 DIAGNOSIS — R488 Other symbolic dysfunctions: Secondary | ICD-10-CM | POA: Diagnosis not present

## 2024-03-11 DIAGNOSIS — R2681 Unsteadiness on feet: Secondary | ICD-10-CM | POA: Diagnosis not present

## 2024-03-11 DIAGNOSIS — M6281 Muscle weakness (generalized): Secondary | ICD-10-CM | POA: Diagnosis not present

## 2024-03-14 DIAGNOSIS — K219 Gastro-esophageal reflux disease without esophagitis: Secondary | ICD-10-CM | POA: Diagnosis not present

## 2024-03-15 DIAGNOSIS — Z9181 History of falling: Secondary | ICD-10-CM | POA: Diagnosis not present

## 2024-03-15 DIAGNOSIS — R293 Abnormal posture: Secondary | ICD-10-CM | POA: Diagnosis not present

## 2024-03-15 DIAGNOSIS — M6281 Muscle weakness (generalized): Secondary | ICD-10-CM | POA: Diagnosis not present

## 2024-03-15 DIAGNOSIS — R29898 Other symptoms and signs involving the musculoskeletal system: Secondary | ICD-10-CM | POA: Diagnosis not present

## 2024-03-16 DIAGNOSIS — M6281 Muscle weakness (generalized): Secondary | ICD-10-CM | POA: Diagnosis not present

## 2024-03-16 DIAGNOSIS — R2681 Unsteadiness on feet: Secondary | ICD-10-CM | POA: Diagnosis not present

## 2024-03-22 DIAGNOSIS — Z9181 History of falling: Secondary | ICD-10-CM | POA: Diagnosis not present

## 2024-03-22 DIAGNOSIS — R293 Abnormal posture: Secondary | ICD-10-CM | POA: Diagnosis not present

## 2024-03-22 DIAGNOSIS — R29898 Other symptoms and signs involving the musculoskeletal system: Secondary | ICD-10-CM | POA: Diagnosis not present

## 2024-03-22 DIAGNOSIS — R2681 Unsteadiness on feet: Secondary | ICD-10-CM | POA: Diagnosis not present

## 2024-03-22 DIAGNOSIS — M6281 Muscle weakness (generalized): Secondary | ICD-10-CM | POA: Diagnosis not present

## 2024-03-23 DIAGNOSIS — M25512 Pain in left shoulder: Secondary | ICD-10-CM | POA: Diagnosis not present

## 2024-03-25 DIAGNOSIS — M25512 Pain in left shoulder: Secondary | ICD-10-CM | POA: Diagnosis not present

## 2024-03-27 DIAGNOSIS — S59902A Unspecified injury of left elbow, initial encounter: Secondary | ICD-10-CM | POA: Diagnosis not present

## 2024-03-27 DIAGNOSIS — E785 Hyperlipidemia, unspecified: Secondary | ICD-10-CM | POA: Diagnosis not present

## 2024-03-27 DIAGNOSIS — I10 Essential (primary) hypertension: Secondary | ICD-10-CM | POA: Diagnosis not present

## 2024-03-27 DIAGNOSIS — S22068A Other fracture of T7-T8 thoracic vertebra, initial encounter for closed fracture: Secondary | ICD-10-CM | POA: Diagnosis not present

## 2024-03-27 DIAGNOSIS — T1490XA Injury, unspecified, initial encounter: Secondary | ICD-10-CM | POA: Diagnosis not present

## 2024-03-27 DIAGNOSIS — S199XXA Unspecified injury of neck, initial encounter: Secondary | ICD-10-CM | POA: Diagnosis not present

## 2024-03-27 DIAGNOSIS — R937 Abnormal findings on diagnostic imaging of other parts of musculoskeletal system: Secondary | ICD-10-CM | POA: Diagnosis not present

## 2024-03-27 DIAGNOSIS — S8992XA Unspecified injury of left lower leg, initial encounter: Secondary | ICD-10-CM | POA: Diagnosis not present

## 2024-03-27 DIAGNOSIS — S0081XA Abrasion of other part of head, initial encounter: Secondary | ICD-10-CM | POA: Diagnosis not present

## 2024-03-27 DIAGNOSIS — G1221 Amyotrophic lateral sclerosis: Secondary | ICD-10-CM | POA: Diagnosis not present

## 2024-03-27 DIAGNOSIS — M25561 Pain in right knee: Secondary | ICD-10-CM | POA: Diagnosis not present

## 2024-03-27 DIAGNOSIS — S80212A Abrasion, left knee, initial encounter: Secondary | ICD-10-CM | POA: Diagnosis not present

## 2024-03-27 DIAGNOSIS — I471 Supraventricular tachycardia, unspecified: Secondary | ICD-10-CM | POA: Diagnosis not present

## 2024-03-27 DIAGNOSIS — I4719 Other supraventricular tachycardia: Secondary | ICD-10-CM | POA: Diagnosis not present

## 2024-03-27 DIAGNOSIS — M549 Dorsalgia, unspecified: Secondary | ICD-10-CM | POA: Diagnosis not present

## 2024-03-27 DIAGNOSIS — G4700 Insomnia, unspecified: Secondary | ICD-10-CM | POA: Diagnosis not present

## 2024-03-27 DIAGNOSIS — S80211A Abrasion, right knee, initial encounter: Secondary | ICD-10-CM | POA: Diagnosis not present

## 2024-03-27 DIAGNOSIS — S22058A Other fracture of T5-T6 vertebra, initial encounter for closed fracture: Secondary | ICD-10-CM | POA: Diagnosis not present

## 2024-03-27 DIAGNOSIS — M25562 Pain in left knee: Secondary | ICD-10-CM | POA: Diagnosis not present

## 2024-03-27 DIAGNOSIS — S0993XA Unspecified injury of face, initial encounter: Secondary | ICD-10-CM | POA: Diagnosis not present

## 2024-03-27 DIAGNOSIS — Z87891 Personal history of nicotine dependence: Secondary | ICD-10-CM | POA: Diagnosis not present

## 2024-03-27 DIAGNOSIS — S50312A Abrasion of left elbow, initial encounter: Secondary | ICD-10-CM | POA: Diagnosis not present

## 2024-03-27 DIAGNOSIS — S8991XA Unspecified injury of right lower leg, initial encounter: Secondary | ICD-10-CM | POA: Diagnosis not present

## 2024-03-27 DIAGNOSIS — Z79899 Other long term (current) drug therapy: Secondary | ICD-10-CM | POA: Diagnosis not present

## 2024-03-27 DIAGNOSIS — R7989 Other specified abnormal findings of blood chemistry: Secondary | ICD-10-CM | POA: Diagnosis not present

## 2024-03-27 DIAGNOSIS — Z515 Encounter for palliative care: Secondary | ICD-10-CM | POA: Diagnosis not present

## 2024-03-27 DIAGNOSIS — Y92009 Unspecified place in unspecified non-institutional (private) residence as the place of occurrence of the external cause: Secondary | ICD-10-CM | POA: Diagnosis not present

## 2024-03-27 DIAGNOSIS — M25522 Pain in left elbow: Secondary | ICD-10-CM | POA: Diagnosis not present

## 2024-03-27 DIAGNOSIS — W19XXXA Unspecified fall, initial encounter: Secondary | ICD-10-CM | POA: Diagnosis not present

## 2024-03-27 DIAGNOSIS — R0902 Hypoxemia: Secondary | ICD-10-CM | POA: Diagnosis not present

## 2024-03-27 DIAGNOSIS — R918 Other nonspecific abnormal finding of lung field: Secondary | ICD-10-CM | POA: Diagnosis not present

## 2024-03-27 DIAGNOSIS — S0990XA Unspecified injury of head, initial encounter: Secondary | ICD-10-CM | POA: Diagnosis not present

## 2024-03-27 DIAGNOSIS — Z9181 History of falling: Secondary | ICD-10-CM | POA: Diagnosis not present

## 2024-03-27 DIAGNOSIS — G501 Atypical facial pain: Secondary | ICD-10-CM | POA: Diagnosis not present

## 2024-03-27 DIAGNOSIS — S299XXA Unspecified injury of thorax, initial encounter: Secondary | ICD-10-CM | POA: Diagnosis not present

## 2024-03-27 DIAGNOSIS — Z7982 Long term (current) use of aspirin: Secondary | ICD-10-CM | POA: Diagnosis not present

## 2024-04-07 DIAGNOSIS — R1084 Generalized abdominal pain: Secondary | ICD-10-CM | POA: Diagnosis not present

## 2024-04-07 DIAGNOSIS — R079 Chest pain, unspecified: Secondary | ICD-10-CM | POA: Diagnosis not present

## 2024-04-07 DIAGNOSIS — S2242XA Multiple fractures of ribs, left side, initial encounter for closed fracture: Secondary | ICD-10-CM | POA: Diagnosis not present

## 2024-04-07 DIAGNOSIS — R0789 Other chest pain: Secondary | ICD-10-CM | POA: Diagnosis not present

## 2024-04-07 DIAGNOSIS — I10 Essential (primary) hypertension: Secondary | ICD-10-CM | POA: Diagnosis not present

## 2024-04-07 DIAGNOSIS — M549 Dorsalgia, unspecified: Secondary | ICD-10-CM | POA: Diagnosis not present

## 2024-04-11 DIAGNOSIS — R52 Pain, unspecified: Secondary | ICD-10-CM | POA: Diagnosis not present

## 2024-04-11 DIAGNOSIS — M62838 Other muscle spasm: Secondary | ICD-10-CM | POA: Diagnosis not present

## 2024-04-11 DIAGNOSIS — K219 Gastro-esophageal reflux disease without esophagitis: Secondary | ICD-10-CM | POA: Diagnosis not present

## 2024-05-07 DIAGNOSIS — R29890 Loss of height: Secondary | ICD-10-CM | POA: Diagnosis not present

## 2024-05-07 DIAGNOSIS — R29818 Other symptoms and signs involving the nervous system: Secondary | ICD-10-CM | POA: Diagnosis not present

## 2024-05-07 DIAGNOSIS — G9389 Other specified disorders of brain: Secondary | ICD-10-CM | POA: Diagnosis not present

## 2024-05-07 DIAGNOSIS — R590 Localized enlarged lymph nodes: Secondary | ICD-10-CM | POA: Diagnosis not present

## 2024-05-07 DIAGNOSIS — R918 Other nonspecific abnormal finding of lung field: Secondary | ICD-10-CM | POA: Diagnosis not present

## 2024-05-07 DIAGNOSIS — I6501 Occlusion and stenosis of right vertebral artery: Secondary | ICD-10-CM | POA: Diagnosis not present

## 2024-05-07 DIAGNOSIS — G1221 Amyotrophic lateral sclerosis: Secondary | ICD-10-CM | POA: Diagnosis not present

## 2024-05-07 DIAGNOSIS — R410 Disorientation, unspecified: Secondary | ICD-10-CM | POA: Diagnosis not present

## 2024-05-07 DIAGNOSIS — I10 Essential (primary) hypertension: Secondary | ICD-10-CM | POA: Diagnosis not present

## 2024-05-07 DIAGNOSIS — Z87891 Personal history of nicotine dependence: Secondary | ICD-10-CM | POA: Diagnosis not present

## 2024-07-26 DIAGNOSIS — I10 Essential (primary) hypertension: Secondary | ICD-10-CM | POA: Diagnosis not present

## 2024-07-26 DIAGNOSIS — S0181XA Laceration without foreign body of other part of head, initial encounter: Secondary | ICD-10-CM | POA: Diagnosis not present

## 2024-07-26 DIAGNOSIS — S80211A Abrasion, right knee, initial encounter: Secondary | ICD-10-CM | POA: Diagnosis not present

## 2024-07-26 DIAGNOSIS — R296 Repeated falls: Secondary | ICD-10-CM | POA: Diagnosis not present

## 2024-07-26 DIAGNOSIS — S0990XA Unspecified injury of head, initial encounter: Secondary | ICD-10-CM | POA: Diagnosis not present

## 2025-01-29 DEATH — deceased
